# Patient Record
Sex: Male | Born: 1975 | Race: White | Hispanic: No | Marital: Married | Smoking: Never smoker
Health system: Southern US, Community
[De-identification: ages and names within clinical notes are randomized; demographics above are authoritative.]

## PROBLEM LIST (undated history)

## (undated) DIAGNOSIS — J45909 Unspecified asthma, uncomplicated: Secondary | ICD-10-CM

## (undated) DIAGNOSIS — M109 Gout, unspecified: Secondary | ICD-10-CM

---

## 2016-12-07 ENCOUNTER — Encounter (HOSPITAL_COMMUNITY): Payer: Self-pay | Admitting: Family Medicine

## 2016-12-07 ENCOUNTER — Emergency Department (HOSPITAL_COMMUNITY)
Admission: EM | Admit: 2016-12-07 | Discharge: 2016-12-08 | Disposition: A | Discharge status: Home / Self Care | Payer: BLUE CROSS/BLUE SHIELD | Attending: Emergency Medicine | Admitting: Emergency Medicine

## 2016-12-07 DIAGNOSIS — M109 Gout, unspecified: Secondary | ICD-10-CM | POA: Insufficient documentation

## 2016-12-07 DIAGNOSIS — L03116 Cellulitis of left lower limb: Secondary | ICD-10-CM | POA: Insufficient documentation

## 2016-12-07 DIAGNOSIS — L539 Erythematous condition, unspecified: Secondary | ICD-10-CM | POA: Diagnosis present

## 2016-12-07 HISTORY — DX: Gout, unspecified: M10.9

## 2016-12-07 HISTORY — DX: Unspecified asthma, uncomplicated: J45.909

## 2016-12-07 MED ORDER — DEXAMETHASONE SODIUM PHOSPHATE 10 MG/ML IJ SOLN
10.0000 mg | Freq: Once | INTRAMUSCULAR | Status: AC
  Administered 2016-12-07: 10 mg via INTRAMUSCULAR
  Filled 2016-12-07: qty 1

## 2016-12-07 MED ORDER — DOXYCYCLINE HYCLATE 100 MG PO CAPS: 100.0000 mg | ORAL_CAPSULE | Freq: Two times a day (BID) | ORAL | 0 refills | Status: DC

## 2016-12-07 NOTE — ED Provider Notes (Signed)
[facility]-EMERGENCY DEPT Provider Note   CSN: [id number] Arrival date & time: 12/07/16  2031     History   Chief Complaint Chief Complaint  Patient presents with  . Insect Bite    HPI Dustin Orr is a 41 y.o. male.  Patient presents to the ER for evaluation of a bite on the left leg. Patient reports that he felt an itchy area on the back of his left lower leg 4 days ago. He scratched the area and it popped "like a zit". Since then the area has been tender and surrounded by reddened area. No further drainage.  Patient also reports that this evening he started having pain and swelling of his left great toe. 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 Denies injury.      Past Medical History:  Diagnosis Date  . Asthma   . Gout     There are no active problems to display for this patient.   History reviewed. No pertinent surgical history.     Home Medications    Prior to Admission medications   Medication Sig Start Date End Date Taking? Authorizing Provider  albuterol (PROVENTIL HFA;VENTOLIN HFA) 108 (90 Base) MCG/ACT inhaler Inhale 1-2 puffs into the lungs every 6 (six) hours as needed for wheezing or shortness of breath.   Yes [provider]  indomethacin (INDOCIN) 50 MG capsule Take 50 mg by mouth daily as needed for other. 12/02/16  Yes [provider]  Multiple Vitamin (MULTIVITAMIN WITH MINERALS) TABS tablet Take 1 tablet by mouth daily.   Yes [provider]  Omega-3 Fatty Acids (FISH OIL BURP-LESS PO) Take 1 tablet by mouth daily.   Yes [provider]  Probiotic Product (PROBIOTIC ADVANCED PO) Take 1 tablet by mouth daily.   Yes [provider]  doxycycline (VIBRAMYCIN) 100 MG capsule Take 1 capsule (100 mg total) by mouth 2 (two) times daily. 12/07/16   Gilda Crease, MD    Family History History reviewed. No pertinent family history.  Social History Social History  Substance Use  Topics  . Smoking status: Never Smoker  . Smokeless tobacco: Never Used  . Alcohol use Yes     Comment: Weekends     Allergies   Penicillins   Review of Systems Review of Systems  Musculoskeletal: Positive for arthralgias.  Skin: Positive for wound.  All other systems reviewed and are negative.    Physical Exam Updated Vital Signs BP (!) 143/99 (BP Location: Left Arm)   Pulse 76   Temp 97.8 F (36.6 C) (Oral)   Resp 20   Ht 6\' 3"  (1.905 m)   Wt 117.9 kg (260 lb)   SpO2 95%   BMI 32.50 kg/m   Physical Exam  Constitutional: He is oriented to person, place, and time. He appears well-developed and well-nourished. No distress.  HENT:  Head: Normocephalic and atraumatic.  Right Ear: Hearing normal.  Left Ear: Hearing normal.  Nose: Nose normal.  Mouth/Throat: Oropharynx is clear and moist and mucous membranes are normal.  Eyes: Pupils are equal, round, and reactive to light. Conjunctivae and EOM are normal.  Neck: Normal range of motion. Neck supple.  Cardiovascular: Regular rhythm, S1 normal and S2 normal.  Exam reveals no gallop and no friction rub.   No murmur heard. Pulmonary/Chest: Effort normal and breath sounds normal. No respiratory distress. He exhibits no tenderness.  Abdominal: Soft. Normal appearance and bowel sounds are normal. There is no hepatosplenomegaly. There  is no tenderness. There is no rebound, no guarding, no tenderness at McBurney's point and negative Murphy's sign. No hernia.  Musculoskeletal: Normal range of motion.       Left foot: There is tenderness (great toe) and swelling (great toe).  Neurological: He is alert and oriented to person, place, and time. He has normal strength. No cranial nerve deficit or sensory deficit. Coordination normal. GCS eye subscore is 4. GCS verbal subscore is 5. GCS motor subscore is 6.  Skin: Skin is warm, dry and intact. No rash noted. No cyanosis.  1cm eschar surrounded by 2cm area of erythema - no fluctuance    Psychiatric: He has a normal mood and affect. His speech is normal and behavior is normal. Thought content normal.  Nursing note and vitals reviewed.    ED Treatments / Results  Labs (all labs ordered are listed, but only abnormal results are displayed) Labs Reviewed - No data to display  EKG  EKG Interpretation None       Radiology No results found.  Procedures Procedures (including critical care time)  Medications Ordered in ED Medications  dexamethasone (DECADRON) injection 10 mg (not administered)     Initial Impression / Assessment and Plan / ED Course  I have reviewed the triage vital signs and the nursing notes.  Pertinent labs & imaging results that were available during my care of the patient were reviewed by me and considered in my medical decision making (see chart for details).     Patient presents primarily with concern over lesion on the back of his leg. The area does appear to have some central eschar surrounded by erythema. It is difficult to determine if this is local irritation from envenomation or if it is infection. Will empirically cover. Patient does not have any rash elsewhere. No known tick bite. Rash does not resemble erythema chronicum migrans.  Additionally, patient is suffering from acute gout flare of his great toe, has had recurrent similar symptoms in the past. Will treat with Decadron.  Final Clinical Impressions(s) / ED Diagnoses   Final diagnoses:  Acute gout involving toe of left foot, unspecified cause  Cellulitis of left lower extremity    New Prescriptions New Prescriptions   DOXYCYCLINE (VIBRAMYCIN) 100 MG CAPSULE    Take 1 capsule (100 mg total) by mouth 2 (two) times daily.     Gilda CreasePollina, Keeven Matty J, MD 12/07/16 [phone]

## 2016-12-07 NOTE — ED Triage Notes (Signed)
Patient reports on Friday he noticed he had a itch on his left calf leg. When he scratched, he heard a pop. Pt believes he got bit my an insect but never felt the bite. Patients left calf is swollen, and has an abscess to his left calf. Denies fever.

## 2021-05-21 ENCOUNTER — Other Ambulatory Visit: Payer: Self-pay

## 2021-05-21 ENCOUNTER — Emergency Department (HOSPITAL_BASED_OUTPATIENT_CLINIC_OR_DEPARTMENT_OTHER): Payer: Commercial Managed Care - PPO

## 2021-05-21 ENCOUNTER — Emergency Department (HOSPITAL_BASED_OUTPATIENT_CLINIC_OR_DEPARTMENT_OTHER)
Admission: EM | Admit: 2021-05-21 | Discharge: 2021-05-21 | Disposition: A | Discharge status: Home / Self Care | Payer: Commercial Managed Care - PPO | Attending: Emergency Medicine | Admitting: Emergency Medicine

## 2021-05-21 ENCOUNTER — Encounter (HOSPITAL_BASED_OUTPATIENT_CLINIC_OR_DEPARTMENT_OTHER): Payer: Self-pay

## 2021-05-21 DIAGNOSIS — Z79899 Other long term (current) drug therapy: Secondary | ICD-10-CM | POA: Insufficient documentation

## 2021-05-21 DIAGNOSIS — Y9301 Activity, walking, marching and hiking: Secondary | ICD-10-CM | POA: Insufficient documentation

## 2021-05-21 DIAGNOSIS — S022XXA Fracture of nasal bones, initial encounter for closed fracture: Secondary | ICD-10-CM | POA: Diagnosis not present

## 2021-05-21 DIAGNOSIS — S01111A Laceration without foreign body of right eyelid and periocular area, initial encounter: Secondary | ICD-10-CM | POA: Diagnosis not present

## 2021-05-21 DIAGNOSIS — S01112A Laceration without foreign body of left eyelid and periocular area, initial encounter: Secondary | ICD-10-CM | POA: Insufficient documentation

## 2021-05-21 DIAGNOSIS — W01198A Fall on same level from slipping, tripping and stumbling with subsequent striking against other object, initial encounter: Secondary | ICD-10-CM | POA: Insufficient documentation

## 2021-05-21 DIAGNOSIS — S0181XA Laceration without foreign body of other part of head, initial encounter: Secondary | ICD-10-CM

## 2021-05-21 DIAGNOSIS — S0993XA Unspecified injury of face, initial encounter: Secondary | ICD-10-CM | POA: Diagnosis present

## 2021-05-21 MED ORDER — LIDOCAINE HCL (PF) 1 % IJ SOLN
30.0000 mL | Freq: Once | INTRAMUSCULAR | Status: AC
  Administered 2021-05-21: 30 mL via INTRADERMAL
  Filled 2021-05-21: qty 30

## 2021-05-21 MED ORDER — LIDOCAINE-EPINEPHRINE-TETRACAINE (LET) TOPICAL GEL
3.0000 mL | Freq: Once | TOPICAL | Status: AC
Start: 2021-05-21 — End: 2021-05-21
  Administered 2021-05-21: 3 mL via TOPICAL
  Filled 2021-05-21: qty 3

## 2021-05-21 MED ORDER — OXYCODONE-ACETAMINOPHEN 5-325 MG PO TABS
1.0000 | ORAL_TABLET | Freq: Once | ORAL | Status: AC
  Administered 2021-05-21: 1 via ORAL
  Filled 2021-05-21: qty 1

## 2021-05-21 MED ORDER — OXYCODONE-ACETAMINOPHEN 5-325 MG PO TABS: 1.0000 | ORAL_TABLET | Freq: Four times a day (QID) | ORAL | 0 refills | Status: DC | PRN

## 2021-05-21 MED ORDER — LIDOCAINE-EPINEPHRINE (PF) 2 %-1:200000 IJ SOLN
20.0000 mL | Freq: Once | INTRAMUSCULAR | Status: DC
  Filled 2021-05-21: qty 20

## 2021-05-21 NOTE — ED Provider Notes (Signed)
[facility] EMERGENCY DEPT Provider Note   CSN: [id number] Arrival date & time: 05/21/21  1557     History  Chief Complaint  Patient presents with   Quan Cybulski is a 46 y.o. male who presents to the emergency department with a facial injury that occurred just prior to arrival.  Patient states that he was walking in his workshop when he slipped on some water fell forward and struck his head against a metal workstation.  He denies losing consciousness.  He did sustain 2 lacerations which are localized to both eyebrows.  No other injury. He denies pain.   Fall      Home Medications Prior to Admission medications   Medication Sig Start Date End Date Taking? Authorizing Provider  oxyCODONE-acetaminophen (PERCOCET/ROXICET) 5-325 MG tablet Take 1 tablet by mouth every 6 (six) hours as needed for severe pain. 05/21/21  Yes Meredeth Ide, Ryleah Miramontes M, PA-C  albuterol (PROVENTIL HFA;VENTOLIN HFA) 108 (90 Base) MCG/ACT inhaler Inhale 1-2 puffs into the lungs every 6 (six) hours as needed for wheezing or shortness of breath.    [provider]  doxycycline (VIBRAMYCIN) 100 MG capsule Take 1 capsule (100 mg total) by mouth 2 (two) times daily. 12/07/16   Gilda Crease, MD  indomethacin (INDOCIN) 50 MG capsule Take 50 mg by mouth daily as needed for other. 12/02/16   [provider]  Multiple Vitamin (MULTIVITAMIN WITH MINERALS) TABS tablet Take 1 tablet by mouth daily.    [provider]  Omega-3 Fatty Acids (FISH OIL BURP-LESS PO) Take 1 tablet by mouth daily.    [provider]  Probiotic Product (PROBIOTIC ADVANCED PO) Take 1 tablet by mouth daily.    [provider]      Allergies    Penicillins    Review of Systems   Review of Systems  All other systems reviewed and are negative.  Physical Exam Updated Vital Signs BP (!) 174/116    Pulse (!) 107    Temp 98.8 F (37.1 C) (Oral)    Resp 17    Ht 6\' 3"  (1.905 m)    Wt  115.7 kg    SpO2 98%    BMI 31.87 kg/m  Physical Exam Vitals and nursing note reviewed.  Constitutional:      Appearance: Normal appearance.  HENT:     Head: Normocephalic.     Comments: Developing bilateral ecchymosis surrounding the eyes.  No painful extraocular movements.  There is approximately 4 cm stellate laceration above the left eyebrow.  There is approximately a 2.5cm linear laceration over the right eyebrow.  There is a superficial abrasion over the bridge of the nose. Eyes:     General:        Right eye: No discharge.        Left eye: No discharge.     Conjunctiva/sclera: Conjunctivae normal.  Pulmonary:     Effort: Pulmonary effort is normal.  Skin:    General: Skin is warm and dry.     Findings: No rash.  Neurological:     General: No focal deficit present.     Mental Status: He is alert.  Psychiatric:        Mood and Affect: Mood normal.        Behavior: Behavior normal.    ED Results / Procedures / Treatments   Labs (all labs ordered are listed, but only abnormal results are displayed) Labs Reviewed - No data to display  EKG None  Radiology CT Head Wo Contrast  Result Date: 05/21/2021 CLINICAL DATA:  Facial trauma, blunt facial trauma; Head trauma, moderate-severe head injury EXAM: CT HEAD WITHOUT CONTRAST CT MAXILLOFACIAL WITHOUT CONTRAST TECHNIQUE: Multidetector CT imaging of the head and maxillofacial structures were performed using the standard protocol without intravenous contrast. Multiplanar CT image reconstructions of the maxillofacial structures were also generated. RADIATION DOSE REDUCTION: This exam was performed according to the departmental dose-optimization program which includes automated exposure control, adjustment of the mA and/or kV according to patient size and/or use of iterative reconstruction technique. COMPARISON:  None. FINDINGS: CT HEAD FINDINGS Brain: No evidence of large-territorial acute infarction. No parenchymal hemorrhage. No mass  lesion. No extra-axial collection. No mass effect or midline shift. No hydrocephalus. Basilar cisterns are patent. Vascular: No hyperdense vessel. Skull: No acute fracture or focal lesion. Other: Mild bilateral frontal scalp subcutaneus soft tissue edema. CT MAXILLOFACIAL FINDINGS Osseous: Bilateral minimally displaced comminuted fractures of the nasal bones as well as comminuted fracture of the nasal septum with 6mm leftward deviation. Sinuses/Orbits: Subcutaneus soft tissue edema of the maxillary sinuses. Paranasal sinuses and mastoid air cells are clear. The orbits are unremarkable. Soft tissues: Left periorbital subcutaneus soft tissue a edema and small hematoma formation. Right periorbital subcutaneus soft tissue edema and small hematoma formation with extension along the right maxillary subcutaneus soft tissues. Nasal subcutaneus soft tissue edema. IMPRESSION: 1. No acute intracranial abnormality. 2. Bilateral minimally displaced comminuted fractures of the nasal bones as well as comminuted fracture of the nasal septum with 6mm leftward deviation. Electronically Signed   By: Tish Frederickson M.D.   On: 05/21/2021 17:34   CT Maxillofacial Wo Contrast  Result Date: 05/21/2021 CLINICAL DATA:  Facial trauma, blunt facial trauma; Head trauma, moderate-severe head injury EXAM: CT HEAD WITHOUT CONTRAST CT MAXILLOFACIAL WITHOUT CONTRAST TECHNIQUE: Multidetector CT imaging of the head and maxillofacial structures were performed using the standard protocol without intravenous contrast. Multiplanar CT image reconstructions of the maxillofacial structures were also generated. RADIATION DOSE REDUCTION: This exam was performed according to the departmental dose-optimization program which includes automated exposure control, adjustment of the mA and/or kV according to patient size and/or use of iterative reconstruction technique. COMPARISON:  None. FINDINGS: CT HEAD FINDINGS Brain: No evidence of large-territorial acute  infarction. No parenchymal hemorrhage. No mass lesion. No extra-axial collection. No mass effect or midline shift. No hydrocephalus. Basilar cisterns are patent. Vascular: No hyperdense vessel. Skull: No acute fracture or focal lesion. Other: Mild bilateral frontal scalp subcutaneus soft tissue edema. CT MAXILLOFACIAL FINDINGS Osseous: Bilateral minimally displaced comminuted fractures of the nasal bones as well as comminuted fracture of the nasal septum with 6mm leftward deviation. Sinuses/Orbits: Subcutaneus soft tissue edema of the maxillary sinuses. Paranasal sinuses and mastoid air cells are clear. The orbits are unremarkable. Soft tissues: Left periorbital subcutaneus soft tissue a edema and small hematoma formation. Right periorbital subcutaneus soft tissue edema and small hematoma formation with extension along the right maxillary subcutaneus soft tissues. Nasal subcutaneus soft tissue edema. IMPRESSION: 1. No acute intracranial abnormality. 2. Bilateral minimally displaced comminuted fractures of the nasal bones as well as comminuted fracture of the nasal septum with 6mm leftward deviation. Electronically Signed   By: Tish Frederickson M.D.   On: 05/21/2021 17:34    Procedures .Marland KitchenLaceration Repair  Date/Time: 05/21/2021 6:30 PM Performed by: Teressa Lower, PA-C Authorized by: Teressa Lower, PA-C   Consent:    Consent obtained:  Verbal   Consent given by:  Patient  Risks, benefits, and alternatives were discussed: yes     Risks discussed:  Infection, pain and need for additional repair   Alternatives discussed:  No treatment Universal protocol:    Procedure explained and questions answered to patient or proxy's satisfaction: yes     Relevant documents present and verified: yes     Test results available: yes     Imaging studies available: yes     Patient identity confirmed:  Verbally with patient and arm band Anesthesia:    Anesthesia method:  Topical application and local  infiltration   Topical anesthetic:  LET   Local anesthetic:  Lidocaine 2% w/o epi Laceration details:    Location:  Face   Face location:  R eyebrow   Length (cm):  2.5   Depth (mm):  4 Pre-procedure details:    Preparation:  Patient was prepped and draped in usual sterile fashion Exploration:    Limited defect created (wound extended): no     Hemostasis achieved with:  Direct pressure   Imaging outcome: foreign body not noted     Wound exploration: wound explored through full range of motion and entire depth of wound visualized     Wound extent: areolar tissue violated     Contaminated: no   Treatment:    Area cleansed with:  Chlorhexidine and saline   Amount of cleaning:  Standard   Irrigation solution:  Sterile saline   Debridement:  None   Undermining:  None   Scar revision: no   Skin repair:    Repair method:  Sutures   Suture size:  6-0   Suture material:  Prolene   Number of sutures:  4 Approximation:    Approximation:  Close Repair type:    Repair type:  Complex Post-procedure details:    Dressing:  Antibiotic ointment and non-adherent dressing   Procedure completion:  Tolerated well, no immediate complications .Marland Kitchen.Laceration Repair  Date/Time: 05/21/2021 6:32 PM Performed by: Teressa LowerFleming, Tonesha Tsou M, PA-C Authorized by: Teressa LowerFleming, Alvy Alsop M, PA-C   Consent:    Consent obtained:  Verbal   Consent given by:  Patient   Risks, benefits, and alternatives were discussed: yes     Risks discussed:  Need for additional repair, poor cosmetic result and infection Universal protocol:    Procedure explained and questions answered to patient or proxy's satisfaction: yes     Relevant documents present and verified: yes     Test results available: yes     Imaging studies available: yes     Required blood products, implants, devices, and special equipment available: no     Site/side marked: no     Immediately prior to procedure, a time out was called: no     Patient identity  confirmed:  Verbally with patient and arm band Anesthesia:    Anesthesia method:  Topical application and local infiltration   Topical anesthetic:  LET   Local anesthetic:  Lidocaine 2% w/o epi Laceration details:    Location:  Face   Face location:  L eyebrow   Length (cm):  4.5   Depth (mm):  6 Pre-procedure details:    Preparation:  Patient was prepped and draped in usual sterile fashion Exploration:    Limited defect created (wound extended): no     Hemostasis achieved with:  Direct pressure   Imaging outcome: foreign body not noted     Wound exploration: wound explored through full range of motion and entire depth of wound visualized  Wound extent: areolar tissue violated     Contaminated: no   Treatment:    Area cleansed with:  Chlorhexidine and saline   Amount of cleaning:  Extensive   Irrigation solution:  Sterile saline   Visualized foreign bodies/material removed: no     Debridement:  None   Undermining:  None   Scar revision: no   Skin repair:    Repair method:  Sutures   Suture size:  6-0   Suture material:  Prolene   Suture technique:  Simple interrupted   Number of sutures:  8 Approximation:    Approximation:  Close Repair type:    Repair type:  Complex Post-procedure details:    Dressing:  Antibiotic ointment and non-adherent dressing   Procedure completion:  Tolerated well, no immediate complications  Cardiac monitoring reveals normal sinus rhythm but is tachycardic.  This is likely due to secondary to pain.  Medications Ordered in ED Medications  lidocaine-EPINEPHrine-tetracaine (LET) topical gel (3 mLs Topical Given 05/21/21 1636)  lidocaine (PF) (XYLOCAINE) 1 % injection 30 mL (30 mLs Intradermal Given 05/21/21 1636)  oxyCODONE-acetaminophen (PERCOCET/ROXICET) 5-325 MG per tablet 1 tablet (1 tablet Oral Given 05/21/21 1837)    ED Course/ Medical Decision Making/ A&P Clinical Course as of 05/21/21 1841  Thu May 21, 2021  [id number] 46 year old male  presented emergency department with a mechanical fall, frontal head injury, striking his head on a hard workbench surface today.  He presents with a 2 cm horizontal laceration in the lateral aspect of the right eyebrow, as well as a stellate laceration involving the left eyebrow.  He also demonstrates raccoon eyes on exam.  No hemotympanum or battle sign.  No neck or spinal tenderness or pain.  He reports he does not have a headache.  He is here with his wife at bedside.  We have ordered CT scan of the head and the facial bone given his exam findings.  He will also do laceration repair. [MT]    Clinical Course User Index [MT] Trifan, Kermit Balo, MD                           Medical Decision Making Amount and/or Complexity of Data Reviewed Radiology: ordered.  Risk Prescription drug management.   Larue Lightner is a 46 y.o. male who presents to the emergency department with facial trauma.  Given the mechanism of injury I would like to evaluate with facial imaging and head imaging for intracranial pathology or acute facial fractures.  He does have raccoon eyes developing on exam.  We will likely repair the lacerations after imaging returns.  Patient does not want any pain medication at this time.  He is tachycardic and hypertensive likely to pain.  He is otherwise in no acute distress talking complete sentences.  He is coherent and not altered.  Imaging of the face revealed nasal bone fractures.  CT head was negative.  I agree with the radiologist of rotation.  I did interpret his images myself.  I repaired both lacerations at the bedside.  Please see procedure note above.  Patient tolerated procedure well.  With regards to the nasal bone fractures I will have him follow-up with maxillofacial for further evaluation.  We discussed return precautions.  He will need the sutures out in 5 to 7 days.  Tylenol/ibuprofen for pain.  I will also write him a short course of narcotic pain medication for  breakthrough pain.  We did discuss  concussion symptoms and red flag symptoms.  Given the clinical scenario, I do believe he is safe for outpatient follow-up.  He is safe for discharge.  Final Clinical Impression(s) / ED Diagnoses Final diagnoses:  Facial injury, initial encounter  Facial laceration, initial encounter  Closed fracture of nasal bone, initial encounter    Rx / DC Orders ED Discharge Orders          Ordered    oxyCODONE-acetaminophen (PERCOCET/ROXICET) 5-325 MG tablet  Every 6 hours PRN,   Status:  Discontinued        05/21/21 1839    oxyCODONE-acetaminophen (PERCOCET/ROXICET) 5-325 MG tablet  Every 6 hours PRN,   Status:  Discontinued        05/21/21 1839    oxyCODONE-acetaminophen (PERCOCET/ROXICET) 5-325 MG tablet  Every 6 hours PRN        05/21/21 1840              Honor LohFleming, Arieanna Pressey DoomsM, [place] JerseyPA-C 05/21/21 1842    Terald Sleeperrifan, Matthew J, MD 05/22/21 1057

## 2021-05-21 NOTE — Discharge Instructions (Addendum)
Please keep wounds clean and dry.  You may apply Neosporin to the areas.  Please follow-up with ENT for further evaluation of your nasal fractures.  Please use Percocet for breakthrough pain.  Otherwise, take 600 mg ibuprofen every 6 hours for pain.  Please return to the emergency department for worsening symptoms.  Please return to this emergency department or any of our other [facility] facilities in 5 to 7 days for suture removal.

## 2021-05-21 NOTE — ED Triage Notes (Signed)
Pt reports he had a mechanical fall slipping on water. Pt hit his head on a tool box. Pt with lacerations above both eyes. Denies LOC or vomiting. Pt reports he had back pain prior to the fall which is now worse.

## 2021-12-02 ENCOUNTER — Encounter: Payer: Self-pay | Admitting: Adult Health

## 2021-12-02 ENCOUNTER — Ambulatory Visit (INDEPENDENT_AMBULATORY_CARE_PROVIDER_SITE_OTHER): Payer: Commercial Managed Care - PPO | Admitting: Adult Health

## 2021-12-02 VITALS — BP 130/90 | HR 97 | Temp 97.6°F | Ht 74.5 in | Wt 266.0 lb

## 2021-12-02 DIAGNOSIS — J4530 Mild persistent asthma, uncomplicated: Secondary | ICD-10-CM | POA: Diagnosis not present

## 2021-12-02 DIAGNOSIS — Z1211 Encounter for screening for malignant neoplasm of colon: Secondary | ICD-10-CM

## 2021-12-02 DIAGNOSIS — R030 Elevated blood-pressure reading, without diagnosis of hypertension: Secondary | ICD-10-CM

## 2021-12-02 DIAGNOSIS — M1A09X0 Idiopathic chronic gout, multiple sites, without tophus (tophi): Secondary | ICD-10-CM | POA: Diagnosis not present

## 2021-12-02 DIAGNOSIS — Z7689 Persons encountering health services in other specified circumstances: Secondary | ICD-10-CM

## 2021-12-02 LAB — URIC ACID: Uric Acid, Serum: 9.4 mg/dL — ABNORMAL HIGH (ref 4.0–7.8)

## 2021-12-02 MED ORDER — ALLOPURINOL 100 MG PO TABS: 100.0000 mg | ORAL_TABLET | Freq: Every day | ORAL | 0 refills | Status: DC

## 2021-12-02 NOTE — Progress Notes (Signed)
Patient presents to clinic today to establish care. He is a pleasant 46 year old male who  has a past medical history of Asthma and Gout.   Acute Concerns: Establish Care  Chronic Issues:  Asthma -is seen at allergy and asthma at [facility].  Currently controlled on Flovent 2 puffs twice daily as needed and albuterol 2 puffs as needed.  Symptoms are generally well controlled he typically needs Flovent 2 puffs every day in the morning and may use his rescue inhaler once a week but this varies with weather changes.  Gout - takes Indomethacin 50 mg TID PRN for acute flares. He reports over the last year he has not been able to control it. In the past two months he has had it both feet, both knees, and left elbow. Currently has pain in his right knee.   Elevated Blood pressure -he has not checked at home in multiple weeks but when he was checking he was getting elevated blood pressure readings mostly in the 140s over 90's.  He recently started a new job and has a 7-week at home that was premature.  Seems to have a lot of stress in his life right now.   Health Maintenance: Dental -- Routine Care  Vision -- Routine Care  Immunizations -- UTD  Colonoscopy -- Never had  Diet: Tries to eat a healthy diet Exercise: Exercises multiple times a week with cardio fitness.     Past Medical History:  Diagnosis Date   Asthma    Gout     History reviewed. No pertinent surgical history.  Current Outpatient Medications on File Prior to Visit  Medication Sig Dispense Refill   albuterol (PROVENTIL HFA;VENTOLIN HFA) 108 (90 Base) MCG/ACT inhaler Inhale 1-2 puffs into the lungs every 6 (six) hours as needed for wheezing or shortness of breath.     fluticasone (FLOVENT HFA) 110 MCG/ACT inhaler Inhale 2 puffs into the lungs 2 (two) times daily.     indomethacin (INDOCIN) 50 MG capsule Take 50 mg by mouth daily as needed for other.     loratadine (CLARITIN REDITABS) 10 MG  dissolvable tablet Take by mouth.     Multiple Vitamin (MULTIVITAMIN WITH MINERALS) TABS tablet Take 1 tablet by mouth daily.     Probiotic Product (PROBIOTIC ADVANCED PO) Take 1 tablet by mouth daily.     No current facility-administered medications on file prior to visit.    Allergies  Allergen Reactions   Penicillins Rash    Has patient had a PCN reaction causing immediate rash, facial/tongue/throat swelling, SOB or lightheadedness with hypotension: no Has patient had a PCN reaction causing severe rash involving mucus membranes or skin necrosis: no Has patient had a PCN reaction that required hospitalization: no Has patient had a PCN reaction occurring within the last 10 years: no If all of the above answers are "NO", then may proceed with Cephalosporin use.     Family History  Problem Relation Age of Onset   Hearing loss Mother    Hearing loss Father    Diabetes Maternal Grandfather    Scoliosis Paternal Grandmother     Social History   Socioeconomic History   Marital status: Married    Spouse name: Not on file   Number of children: Not on file   Years of education: Not on file   Highest education level: Not on file  Occupational History   Not on file  Tobacco Use   Smoking status:  Never   Smokeless tobacco: Never  Vaping Use   Vaping Use: Never used  Substance and Sexual Activity   Alcohol use: Yes    Alcohol/week: 12.0 standard drinks of alcohol    Types: 6 Glasses of wine, 6 Cans of beer per week    Comment: Weekends   Drug use: No   Sexual activity: Yes  Other Topics Concern   Not on file  Social History Narrative   Art gallery manager    Social Determinants of Health   Financial Resource Strain: Not on file  Food Insecurity: Not on file  Transportation Needs: Not on file  Physical Activity: Not on file  Stress: Not on file  Social Connections: Not on file  Intimate Partner Violence: Not on file    Review of Systems  Constitutional: Negative.   HENT:  Negative.    Eyes: Negative.   Respiratory: Negative.    Cardiovascular: Negative.   Gastrointestinal: Negative.   Genitourinary: Negative.   Musculoskeletal: Negative.   Skin: Negative.   Neurological: Negative.   Psychiatric/Behavioral: Negative.    All other systems reviewed and are negative.   BP (!) 130/90   Pulse 97   Temp 97.6 F (36.4 C) (Oral)   Ht 6' 2.5" (1.892 m)   Wt 266 lb (120.7 kg)   SpO2 98%   BMI 33.70 kg/m   Physical Exam Vitals and nursing note reviewed.  Constitutional:      Appearance: Normal appearance. He is obese.  Cardiovascular:     Rate and Rhythm: Normal rate and regular rhythm.     Pulses: Normal pulses.     Heart sounds: Normal heart sounds.  Pulmonary:     Effort: Pulmonary effort is normal.     Breath sounds: Normal breath sounds.  Musculoskeletal:        General: Normal range of motion.  Skin:    General: Skin is warm and dry.     Capillary Refill: Capillary refill takes less than 2 seconds.  Neurological:     General: No focal deficit present.     Mental Status: He is alert and oriented to person, place, and time.  Psychiatric:        Mood and Affect: Mood normal.        Behavior: Behavior normal.        Thought Content: Thought content normal.        Judgment: Judgment normal.      Assessment/Plan: 1. Encounter to establish care - Follow up in one month or sooner if needed  2. Mild persistent asthma without complication - Per allergy and asthma   3. Colon cancer screening  - Ambulatory referral to Gastroenterology  4. Chronic gout of multiple sites, unspecified cause - Due to amount of gout flares will start on allopurinol - Retest uric acid in one month  - allopurinol (ZYLOPRIM) 100 MG tablet; Take 1 tablet (100 mg total) by mouth daily.  Dispense: 90 tablet; Refill: 0 - Uric Acid; Future - Uric Acid  5. Elevated blood pressure reading - Will have him monitor at home and bring log with him    Shirline Frees,  NP

## 2021-12-02 NOTE — Patient Instructions (Signed)
It was great meeting you today   Someone will call you to schedule your colonoscopy   I am going to check your uric acid level today and I have sent in allopurinol   Follow up in one month for your physical exam

## 2022-01-01 ENCOUNTER — Ambulatory Visit (INDEPENDENT_AMBULATORY_CARE_PROVIDER_SITE_OTHER): Payer: Commercial Managed Care - PPO | Admitting: Adult Health

## 2022-01-01 ENCOUNTER — Encounter: Payer: Self-pay | Admitting: Adult Health

## 2022-01-01 VITALS — BP 128/86 | HR 72 | Temp 97.8°F | Ht 74.5 in | Wt 271.0 lb

## 2022-01-01 DIAGNOSIS — Z0001 Encounter for general adult medical examination with abnormal findings: Secondary | ICD-10-CM | POA: Diagnosis not present

## 2022-01-01 DIAGNOSIS — E669 Obesity, unspecified: Secondary | ICD-10-CM

## 2022-01-01 DIAGNOSIS — N529 Male erectile dysfunction, unspecified: Secondary | ICD-10-CM

## 2022-01-01 DIAGNOSIS — R030 Elevated blood-pressure reading, without diagnosis of hypertension: Secondary | ICD-10-CM

## 2022-01-01 DIAGNOSIS — Z114 Encounter for screening for human immunodeficiency virus [HIV]: Secondary | ICD-10-CM

## 2022-01-01 DIAGNOSIS — J4530 Mild persistent asthma, uncomplicated: Secondary | ICD-10-CM | POA: Diagnosis not present

## 2022-01-01 DIAGNOSIS — M1A09X0 Idiopathic chronic gout, multiple sites, without tophus (tophi): Secondary | ICD-10-CM | POA: Diagnosis not present

## 2022-01-01 DIAGNOSIS — Z1211 Encounter for screening for malignant neoplasm of colon: Secondary | ICD-10-CM

## 2022-01-01 DIAGNOSIS — Z1159 Encounter for screening for other viral diseases: Secondary | ICD-10-CM

## 2022-01-01 DIAGNOSIS — Z23 Encounter for immunization: Secondary | ICD-10-CM | POA: Diagnosis not present

## 2022-01-01 LAB — CBC WITH DIFFERENTIAL/PLATELET
Basophils Absolute: 0.1 10*3/uL (ref 0.0–0.1)
Basophils Relative: 0.8 % (ref 0.0–3.0)
Eosinophils Absolute: 0.2 10*3/uL (ref 0.0–0.7)
Eosinophils Relative: 3.1 % (ref 0.0–5.0)
HCT: 44.9 % (ref 39.0–52.0)
Hemoglobin: 15.6 g/dL (ref 13.0–17.0)
Lymphocytes Relative: 24.8 % (ref 12.0–46.0)
Lymphs Abs: 1.7 10*3/uL (ref 0.7–4.0)
MCHC: 34.7 g/dL (ref 30.0–36.0)
MCV: 89.6 fl (ref 78.0–100.0)
Monocytes Absolute: 0.7 10*3/uL (ref 0.1–1.0)
Monocytes Relative: 10.3 % (ref 3.0–12.0)
Neutro Abs: 4.2 10*3/uL (ref 1.4–7.7)
Neutrophils Relative %: 61 % (ref 43.0–77.0)
Platelets: 254 10*3/uL (ref 150.0–400.0)
RBC: 5.01 Mil/uL (ref 4.22–5.81)
RDW: 13.2 % (ref 11.5–15.5)
WBC: 6.9 10*3/uL (ref 4.0–10.5)

## 2022-01-01 LAB — COMPREHENSIVE METABOLIC PANEL
ALT: 43 U/L (ref 0–53)
AST: 33 U/L (ref 0–37)
Albumin: 4.5 g/dL (ref 3.5–5.2)
Alkaline Phosphatase: 97 U/L (ref 39–117)
BUN: 21 mg/dL (ref 6–23)
CO2: 30 mEq/L (ref 19–32)
Calcium: 9.8 mg/dL (ref 8.4–10.5)
Chloride: 101 mEq/L (ref 96–112)
Creatinine, Ser: 1.14 mg/dL (ref 0.40–1.50)
GFR: 77.1 mL/min (ref >60.00)
Glucose, Bld: 115 mg/dL — ABNORMAL HIGH (ref 70–99)
Potassium: 4.9 mEq/L (ref 3.5–5.1)
Sodium: 140 mEq/L (ref 135–145)
Total Bilirubin: 0.7 mg/dL (ref 0.2–1.2)
Total Protein: 7.9 g/dL (ref 6.0–8.3)

## 2022-01-01 LAB — LIPID PANEL
Cholesterol: 161 mg/dL (ref 0–200)
HDL: 50 mg/dL (ref >39.00)
LDL Cholesterol: 91 mg/dL (ref 0–99)
NonHDL: 111.22
Total CHOL/HDL Ratio: 3
Triglycerides: 99 mg/dL (ref 0.0–149.0)
VLDL: 19.8 mg/dL (ref 0.0–40.0)

## 2022-01-01 LAB — TSH: TSH: 3.71 u[IU]/mL (ref 0.35–5.50)

## 2022-01-01 LAB — URIC ACID: Uric Acid, Serum: 8.9 mg/dL — ABNORMAL HIGH (ref 4.0–7.8)

## 2022-01-01 LAB — HEMOGLOBIN A1C: Hgb A1c MFr Bld: 5.8 % (ref 4.6–6.5)

## 2022-01-01 MED ORDER — ALLOPURINOL 100 MG PO TABS: 100.0000 mg | ORAL_TABLET | Freq: Every day | ORAL | 3 refills | Status: DC

## 2022-01-01 NOTE — Progress Notes (Signed)
Subjective:    Patient ID: Dustin Orr, male    DOB: Sep 10, 1975, 46 y.o.   MRN: [id number]  HPI Patient presents for yearly preventative medicine examination. He is a pleasant 46 year old male who  has a past medical history of Asthma and Gout.  Asthma-is seen by allergy and asthma at [facility].  Currently controlled on Flovent 2 puffs twice daily as needed and albuterol 2 puffs every 6 hours as needed.  He reports that his symptoms are generally well controlled and he typically needs Flovent in the morning and maybe uses his rescue inhaler once a week  Gout-takes indomethacin 50 mg 3 times daily as needed for acute flares.  During his establish care visit a month ago he reported that he had multiple gout flares over the year with gout presenting and bilateral feet, bilateral knees, and left elbow.  He was placed on allopurinol 100 mg daily at this time. He did not know he had a prescription at the pharmacy for allopurinol and never picked it up  Lab Results  Component Value Date   LABURIC 9.4 (H) 12/02/2021   ED - reports trouble maintaining an erection. Does not have trouble getting an erection.     All immunizations and health maintenance protocols were reviewed with the patient and needed orders were placed.  Appropriate screening laboratory values were ordered for the patient including screening of hyperlipidemia, renal function and hepatic function.  Medication reconciliation,  past medical history, social history, problem list and allergies were reviewed in detail with the patient  Goals were established with regard to weight loss, exercise, and  diet in compliance with medications. He has started working out again and is trying to eat healthy.  Wt Readings from Last 3 Encounters:  01/01/22 271 lb (122.9 kg)  12/02/21 266 lb (120.7 kg)  05/21/21 255 lb (115.7 kg)   He would rather do a cologuard test vs colonoscopy.   Review of Systems  Constitutional: Negative.    HENT: Negative.    Eyes: Negative.   Respiratory: Negative.    Cardiovascular: Negative.   Gastrointestinal: Negative.   Endocrine: Negative.   Genitourinary: Negative.   Musculoskeletal: Negative.   Skin: Negative.   Allergic/Immunologic: Negative.   Neurological: Negative.   Hematological: Negative.   Psychiatric/Behavioral: Negative.    All other systems reviewed and are negative.  Past Medical History:  Diagnosis Date   Asthma    Gout     Social History   Socioeconomic History   Marital status: Married    Spouse name: Not on file   Number of children: Not on file   Years of education: Not on file   Highest education level: Not on file  Occupational History   Not on file  Tobacco Use   Smoking status: Never   Smokeless tobacco: Never  Vaping Use   Vaping Use: Never used  Substance and Sexual Activity   Alcohol use: Yes    Alcohol/week: 12.0 standard drinks of alcohol    Types: 6 Glasses of wine, 6 Cans of beer per week    Comment: Weekends   Drug use: No   Sexual activity: Yes  Other Topics Concern   Not on file  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 No pertinent surgical history.  Family History  Problem Relation Age of Onset   Hearing loss Mother    Hearing loss Father    Diabetes Maternal Grandfather    Scoliosis Paternal Grandmother     Allergies  Allergen Reactions   Penicillins Rash    Has patient had a PCN reaction causing immediate rash, facial/tongue/throat swelling, SOB or lightheadedness with hypotension: no Has patient had a PCN reaction causing severe rash involving mucus membranes or skin necrosis: no Has patient had a PCN reaction that required  hospitalization: no Has patient had a PCN reaction occurring within the last 10 years: no If all of the above answers are "NO", then may proceed with Cephalosporin use.     Current Outpatient Medications on File Prior to Visit  Medication Sig Dispense Refill   albuterol (PROVENTIL HFA;VENTOLIN HFA) 108 (90 Base) MCG/ACT inhaler Inhale 1-2 puffs into the lungs every 6 (six) hours as needed for wheezing or shortness of breath.     fluticasone (FLOVENT HFA) 110 MCG/ACT inhaler Inhale 2 puffs into the lungs 2 (two) times daily.     indomethacin (INDOCIN) 50 MG capsule Take 50 mg by mouth daily as needed for other.     loratadine (CLARITIN REDITABS) 10 MG dissolvable tablet Take by mouth.     Multiple Vitamin (MULTIVITAMIN WITH MINERALS) TABS tablet Take 1 tablet by mouth daily.     Probiotic Product (PROBIOTIC ADVANCED PO) Take 1 tablet by mouth daily.     No current facility-administered medications on file prior to visit.    BP 128/86   Pulse 72   Temp 97.8 F (36.6 C) (Oral)   Ht 6' 2.5" (1.892 m)   Wt 271 lb (122.9 kg)   SpO2 99%   BMI 34.33 kg/m       Objective:   Physical Exam Vitals and nursing note reviewed.  Constitutional:      General: He is not in acute distress.    Appearance: Normal appearance. He is well-developed. He is obese.  HENT:     Head: Normocephalic and atraumatic.     Right Ear: Tympanic membrane, ear canal and external ear normal. There is no impacted cerumen.     Left Ear: Tympanic membrane, ear canal and external ear normal. There is no impacted cerumen.     Nose: Nose normal. No congestion or rhinorrhea.     Mouth/Throat:     Mouth: Mucous membranes are moist.     Pharynx: Oropharynx is clear. No oropharyngeal exudate or posterior oropharyngeal erythema.  Eyes:     General:        Right eye: No discharge.        Left eye: No discharge.     Extraocular Movements: Extraocular movements intact.     Conjunctiva/sclera: Conjunctivae normal.      Pupils: Pupils are equal, round, and reactive to light.  Neck:     Vascular: No carotid bruit.     Trachea: No tracheal deviation.  Cardiovascular:     Rate and Rhythm: Normal rate and regular rhythm.     Pulses: Normal pulses.     Heart sounds: Normal heart sounds. No murmur heard.    No friction rub. No gallop.  Pulmonary:     Effort: Pulmonary effort is normal. No respiratory distress.     Breath sounds: Normal breath sounds. No stridor. No wheezing, rhonchi or  rales.  Chest:     Chest wall: No tenderness.  Abdominal:     General: Bowel sounds are normal. There is no distension.     Palpations: Abdomen is soft. There is no mass.     Tenderness: There is no abdominal tenderness. There is no right CVA tenderness, left CVA tenderness, guarding or rebound.     Hernia: No hernia is present.  Musculoskeletal:        General: No swelling, tenderness, deformity or signs of injury. Normal range of motion.     Right lower leg: No edema.     Left lower leg: No edema.  Lymphadenopathy:     Cervical: No cervical adenopathy.  Skin:    General: Skin is warm and dry.     Capillary Refill: Capillary refill takes less than 2 seconds.     Coloration: Skin is not jaundiced or pale.     Findings: No bruising, erythema, lesion or rash.  Neurological:     General: No focal deficit present.     Mental Status: He is alert and oriented to person, place, and time.     Cranial Nerves: No cranial nerve deficit.     Sensory: No sensory deficit.     Motor: No weakness.     Coordination: Coordination normal.     Gait: Gait normal.     Deep Tendon Reflexes: Reflexes normal.  Psychiatric:        Mood and Affect: Mood normal.        Behavior: Behavior normal.        Thought Content: Thought content normal.        Judgment: Judgment normal.       Assessment & Plan:  1. Encounter for general adult medical examination with abnormal findings - Encouraged to work on weight loss  - Follow up in one year  or sooner if needed - CBC with Differential/Platelet; Future - Comprehensive metabolic panel; Future - Hemoglobin A1c; Future - Lipid panel; Future - TSH; Future  2. Mild persistent asthma without complication - Controlled.  - CBC with Differential/Platelet; Future - Comprehensive metabolic panel; Future - Hemoglobin A1c; Future - Lipid panel; Future - TSH; Future  3. Chronic gout of multiple sites, unspecified cause - CBC with Differential/Platelet; Future - Comprehensive metabolic panel; Future - Hemoglobin A1c; Future - Lipid panel; Future - TSH; Future - Uric Acid; Future - allopurinol (ZYLOPRIM) 100 MG tablet; Take 1 tablet (100 mg total) by mouth daily.  Dispense: 90 tablet; Refill: 3  4. Encounter for screening for HIV  - HIV Antibody (routine testing w rflx); Future  5. Need for hepatitis C screening test  - Hep C Antibody; Future  6. Screening for colon cancer  - Cologuard  8. Erectile dysfunction, unspecified erectile dysfunction type - Encouraged weight loss  - Can consider Cialis   9. Class 1 obesity - Needs to work on weight loss through diet and exercise  10. Need for Tdap vaccination  - Tdap vaccine greater than or equal to 7yo IM    Shirline Frees, NP

## 2022-01-01 NOTE — Patient Instructions (Signed)
It was great seeing you today   We will follow up with you regarding your lab work   Please let me know if you need anything   

## 2022-01-04 LAB — HEPATITIS C ANTIBODY: Hepatitis C Ab: NONREACTIVE

## 2022-01-04 LAB — HIV ANTIBODY (ROUTINE TESTING W REFLEX): HIV 1&2 Ab, 4th Generation: NONREACTIVE

## 2022-01-05 ENCOUNTER — Other Ambulatory Visit: Payer: Self-pay | Admitting: Adult Health

## 2022-01-05 MED ORDER — TADALAFIL 20 MG PO TABS: 10.0000 mg | ORAL_TABLET | ORAL | 6 refills | Status: DC | PRN

## 2022-01-13 ENCOUNTER — Encounter: Payer: Self-pay | Admitting: Adult Health

## 2022-01-13 ENCOUNTER — Telehealth (INDEPENDENT_AMBULATORY_CARE_PROVIDER_SITE_OTHER): Payer: Commercial Managed Care - PPO | Admitting: Adult Health

## 2022-01-13 VITALS — Ht 74.5 in | Wt 271.0 lb

## 2022-01-13 DIAGNOSIS — M1A09X0 Idiopathic chronic gout, multiple sites, without tophus (tophi): Secondary | ICD-10-CM | POA: Diagnosis not present

## 2022-01-13 MED ORDER — PREDNISONE 20 MG PO TABS: 20.0000 mg | ORAL_TABLET | Freq: Every day | ORAL | 0 refills | Status: DC

## 2022-01-13 MED ORDER — ALLOPURINOL 300 MG PO TABS: 300.0000 mg | ORAL_TABLET | Freq: Every day | ORAL | 1 refills | Status: DC

## 2022-01-13 NOTE — Progress Notes (Signed)
Virtual Visit via Video Note  I connected with Dustin Orr on 01/13/22 at 11:45 AM EDT by a video enabled telemedicine application and verified that I am speaking with the correct person using two identifiers.  Location patient: home Location provider:work or home office Persons participating in the virtual visit: patient, provider  I discussed the limitations of evaluation and management by telemedicine and the availability of in person appointments. The patient expressed understanding and agreed to proceed.   HPI: 46 year old male who is being evaluated today for an acute on chronic issue.  He has history of gout, currently taking allopurinol 100 mg a day and has been taking this for the last couple weeks.  Unfortunately he developed an acute flare in his right ankle and right toe starting 1 day ago.  Symptoms include pain especially  with ambulation, redness, warmth, and swelling.   ROS: See pertinent positives and negatives per HPI.  Past Medical History:  Diagnosis Date   Asthma    Gout     History reviewed. No pertinent surgical history.  Family History  Problem Relation Age of Onset   Hearing loss Mother    Hearing loss Father    Diabetes Maternal Grandfather    Scoliosis Paternal Grandmother        Current Outpatient Medications:    albuterol (PROVENTIL HFA;VENTOLIN HFA) 108 (90 Base) MCG/ACT inhaler, Inhale 1-2 puffs into the lungs every 6 (six) hours as needed for wheezing or shortness of breath., Disp: , Rfl:    allopurinol (ZYLOPRIM) 100 MG tablet, Take 1 tablet (100 mg total) by mouth daily., Disp: 90 tablet, Rfl: 3   fluticasone (FLOVENT HFA) 110 MCG/ACT inhaler, Inhale 2 puffs into the lungs 2 (two) times daily., Disp: , Rfl:    indomethacin (INDOCIN) 50 MG capsule, Take 50 mg by mouth daily as needed for other., Disp: , Rfl:    loratadine (CLARITIN REDITABS) 10 MG dissolvable tablet, Take by mouth., Disp: , Rfl:    Multiple Vitamin (MULTIVITAMIN WITH  MINERALS) TABS tablet, Take 1 tablet by mouth daily., Disp: , Rfl:    Probiotic Product (PROBIOTIC ADVANCED PO), Take 1 tablet by mouth daily., Disp: , Rfl:    tadalafil (CIALIS) 20 MG tablet, Take 0.5-1 tablets (10-20 mg total) by mouth every other day as needed for erectile dysfunction., Disp: 10 tablet, Rfl: 6  EXAM:  VITALS per patient if applicable:  GENERAL: alert, oriented, appears well and in no acute distress  HEENT: atraumatic, conjunttiva clear, no obvious abnormalities on inspection of external nose and ears  NECK: normal movements of the head and neck  LUNGS: on inspection no signs of respiratory distress, breathing rate appears normal, no obvious gross SOB, gasping or wheezing  CV: no obvious cyanosis  MS: moves all visible extremities without noticeable abnormality  PSYCH/NEURO: pleasant and cooperative, no obvious depression or anxiety, speech and thought processing grossly intact  ASSESSMENT AND PLAN:  Discussed the following assessment and plan:  1. Chronic gout of multiple sites, unspecified cause -We will send in prednisone to help with his acute flare and have him start taking allopurinol 300 mg daily to hopefully prevent future flares.  He can start allopurinol once he is done with the prednisone - predniSONE (DELTASONE) 20 MG tablet; Take 1 tablet (20 mg total) by mouth daily with breakfast.  Dispense: 7 tablet; Refill: 0 - allopurinol (ZYLOPRIM) 300 MG tablet; Take 1 tablet (300 mg total) by mouth daily.  Dispense: 90 tablet; Refill: 1  I discussed the assessment and treatment plan with the patient. The patient was provided an opportunity to ask questions and all were answered. The patient agreed with the plan and demonstrated an understanding of the instructions.   The patient was advised to call back or seek an in-person evaluation if the symptoms worsen or if the condition fails to improve as anticipated.   Dorothyann Peng, NP

## 2022-01-21 LAB — COLOGUARD: COLOGUARD: NEGATIVE

## 2022-02-02 ENCOUNTER — Telehealth: Payer: Self-pay

## 2022-02-02 NOTE — Telephone Encounter (Signed)
Pt states he had a gout flare up yesterday, thinks he may have it under control. Took some ibuprofen, but has now switched over to the allopurinol 300 mg, states if if does not get any better, he will call back to get an appointment.

## 2022-03-02 ENCOUNTER — Ambulatory Visit: Payer: Commercial Managed Care - PPO

## 2022-03-09 ENCOUNTER — Ambulatory Visit: Payer: Commercial Managed Care - PPO

## 2022-03-22 ENCOUNTER — Encounter: Payer: Self-pay | Admitting: Adult Health

## 2022-04-06 ENCOUNTER — Ambulatory Visit (INDEPENDENT_AMBULATORY_CARE_PROVIDER_SITE_OTHER): Payer: Commercial Managed Care - PPO | Admitting: Adult Health

## 2022-04-06 ENCOUNTER — Encounter: Payer: Self-pay | Admitting: Adult Health

## 2022-04-06 VITALS — BP 122/100 | HR 90 | Temp 98.0°F | Ht 74.5 in | Wt 271.0 lb

## 2022-04-06 DIAGNOSIS — M10021 Idiopathic gout, right elbow: Secondary | ICD-10-CM

## 2022-04-06 DIAGNOSIS — M10421 Other secondary gout, right elbow: Secondary | ICD-10-CM

## 2022-04-06 MED ORDER — COLCHICINE 0.6 MG PO TABS: 0.6000 mg | ORAL_TABLET | Freq: Every day | ORAL | 1 refills | Status: DC

## 2022-04-06 MED ORDER — PREDNISONE 20 MG PO TABS: 20.0000 mg | ORAL_TABLET | Freq: Every day | ORAL | 0 refills | Status: DC

## 2022-04-06 NOTE — Progress Notes (Signed)
Subjective:    Patient ID: Dustin Orr, male    DOB: 1976-04-25, 46 y.o.   MRN: [id number]  HPI 46 year old male who  has a past medical history of Asthma and Gout.  He presents to the office today for an acute gout flare of his right elbow. Started about 5 days ago. Possible causative factor was salmon with lemon sauce. Has been taking his Allopurinol 300 mg daily.   Has redness, warmth, swelling and pain with movement to right elbo    Review of Systems See HPI   Past Medical History:  Diagnosis Date   Asthma    Gout     Social History   Socioeconomic History   Marital status: Married    Spouse name: Not on file   Number of children: Not on file   Years of education: Not on file   Highest education level: Not on file  Occupational History   Not on file  Tobacco Use   Smoking status: Never   Smokeless tobacco: Never  Vaping Use   Vaping Use: Never used  Substance and Sexual Activity   Alcohol use: Yes    Alcohol/week: 12.0 standard drinks of alcohol    Types: 6 Glasses of wine, 6 Cans of beer per week    Comment: Weekends   Drug use: No   Sexual activity: Yes  Other Topics Concern   Not on file  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 No pertinent surgical history.  Family History  Problem Relation Age of Onset   Hearing loss Mother    Hearing loss Father    Diabetes Maternal Grandfather    Scoliosis Paternal Grandmother     Allergies  Allergen Reactions   Penicillins Rash    Has patient had a PCN reaction causing immediate rash, facial/tongue/throat swelling, SOB or lightheadedness with hypotension: no Has patient had a PCN reaction causing severe rash  involving mucus membranes or skin necrosis: no Has patient had a PCN reaction that required hospitalization: no Has patient had a PCN reaction occurring within the last 10 years: no If all of the above answers are "NO", then may proceed with Cephalosporin use.     Current Outpatient Medications on File Prior to Visit  Medication Sig Dispense Refill   albuterol (PROVENTIL HFA;VENTOLIN HFA) 108 (90 Base) MCG/ACT inhaler Inhale 1-2 puffs into the lungs every 6 (six) hours as needed for wheezing or shortness of breath.     allopurinol (ZYLOPRIM) 300 MG tablet Take 1 tablet (300 mg total) by mouth daily. 90 tablet 1   fluticasone (FLOVENT HFA) 110 MCG/ACT inhaler Inhale 2 puffs into the lungs 2 (two) times daily.     indomethacin (INDOCIN) 50 MG capsule Take 50 mg by mouth daily as needed for other.     loratadine (CLARITIN REDITABS) 10 MG dissolvable tablet Take by mouth.     Multiple Vitamin (MULTIVITAMIN WITH MINERALS) TABS tablet Take 1 tablet by mouth daily.     Probiotic Product (PROBIOTIC ADVANCED PO) Take 1 tablet by mouth daily.     tadalafil (CIALIS) 20 MG tablet Take 0.5-1 tablets (10-20 mg total) by mouth every other  day as needed for erectile dysfunction. 10 tablet 6   No current facility-administered medications on file prior to visit.    BP (!) 122/100   Pulse 90   Temp 98 F (36.7 C) (Oral)   Ht 6' 2.5" (1.892 m)   Wt 271 lb (122.9 kg)   SpO2 96%   BMI 34.33 kg/m       Objective:   Physical Exam Vitals and nursing note reviewed.  Constitutional:      Appearance: Normal appearance.  Musculoskeletal:        General: Swelling and tenderness present.  Skin:    Capillary Refill: Capillary refill takes less than 2 seconds.     Findings: Erythema present.  Neurological:     General: No focal deficit present.     Mental Status: He is alert and oriented to person, place, and time.  Psychiatric:        Mood and Affect: Mood normal.        Behavior: Behavior normal.         Thought Content: Thought content normal.        Judgment: Judgment normal.       Assessment & Plan:  1. Other secondary acute gout of right elbow  - predniSONE (DELTASONE) 20 MG tablet; Take 1 tablet (20 mg total) by mouth daily with breakfast.  Dispense: 7 tablet; Refill: 0 - colchicine 0.6 MG tablet; Take 1 tablet (0.6 mg total) by mouth daily. Take two tabs on first day of acute flare and then one tab daily until flare has resolved  Dispense: 30 tablet; Refill: 1   Shirline Frees, NP

## 2022-06-10 ENCOUNTER — Encounter: Payer: Self-pay | Admitting: Adult Health

## 2022-06-16 ENCOUNTER — Other Ambulatory Visit: Payer: Self-pay | Admitting: Adult Health

## 2022-06-16 DIAGNOSIS — M10421 Other secondary gout, right elbow: Secondary | ICD-10-CM

## 2022-06-17 MED ORDER — PREDNISONE 20 MG PO TABS: 20.0000 mg | ORAL_TABLET | Freq: Every day | ORAL | 0 refills | Status: DC

## 2022-06-26 ENCOUNTER — Other Ambulatory Visit: Payer: Self-pay | Admitting: Adult Health

## 2022-06-26 DIAGNOSIS — M1A09X0 Idiopathic chronic gout, multiple sites, without tophus (tophi): Secondary | ICD-10-CM

## 2022-12-11 ENCOUNTER — Other Ambulatory Visit: Payer: Self-pay | Admitting: Adult Health

## 2022-12-11 DIAGNOSIS — M1A09X0 Idiopathic chronic gout, multiple sites, without tophus (tophi): Secondary | ICD-10-CM

## 2022-12-11 DIAGNOSIS — M10421 Other secondary gout, right elbow: Secondary | ICD-10-CM

## 2022-12-26 IMAGING — CT CT HEAD W/O CM
4 series · 15 of 47 positions shown, 17 images · non-contrast
Comparison: None.

CLINICAL DATA: Facial trauma, blunt facial trauma; Head trauma,
moderate-severe head injury

EXAM:
CT HEAD WITHOUT CONTRAST
CT MAXILLOFACIAL WITHOUT CONTRAST
TECHNIQUE: Multidetector CT imaging of the head and maxillofacial structures
were performed using the standard protocol without intravenous
contrast. Multiplanar CT image reconstructions of the maxillofacial
structures were also generated.
RADIATION DOSE REDUCTION: This exam was performed according to the
departmental dose-optimization program which includes automated
exposure control, adjustment of the mA and/or kV according to
patient size and/or use of iterative reconstruction technique.

[Series 2: head wo · axial · 0.46mm/px · z∈[+745,+865]mm · 7 of 33 slices shown, 9 images]
[im 5/33  brain]
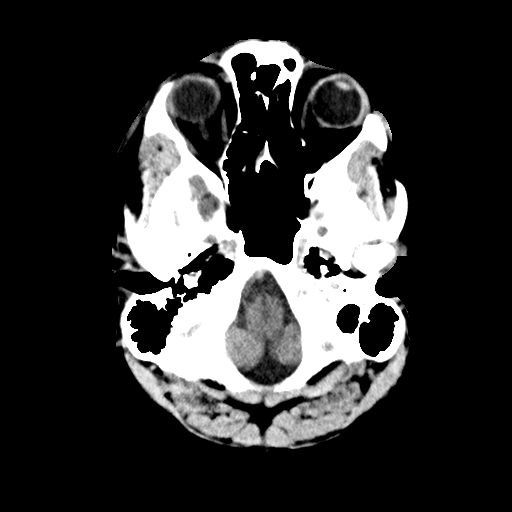
[im 5/33  bone]
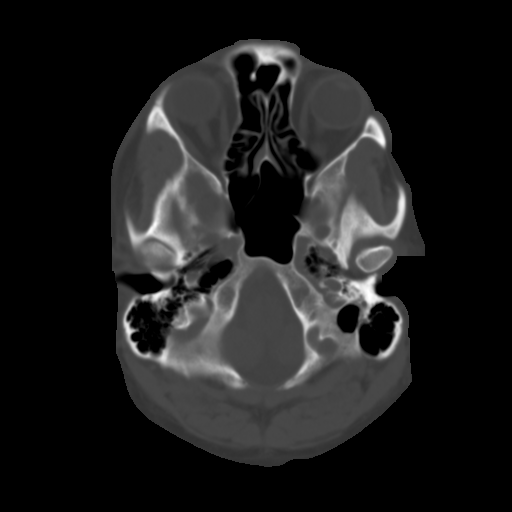
[im 9/33  brain]
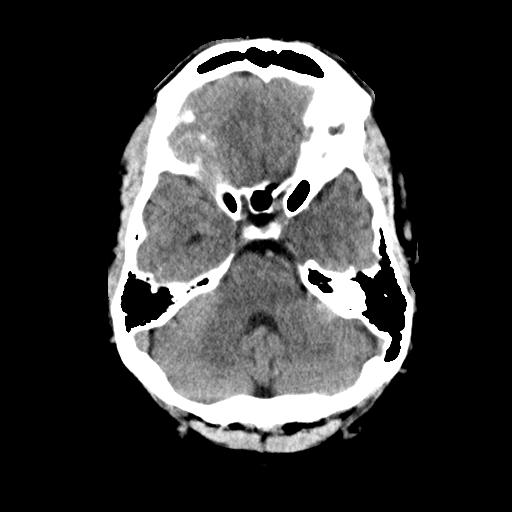
[im 13/33  brain]
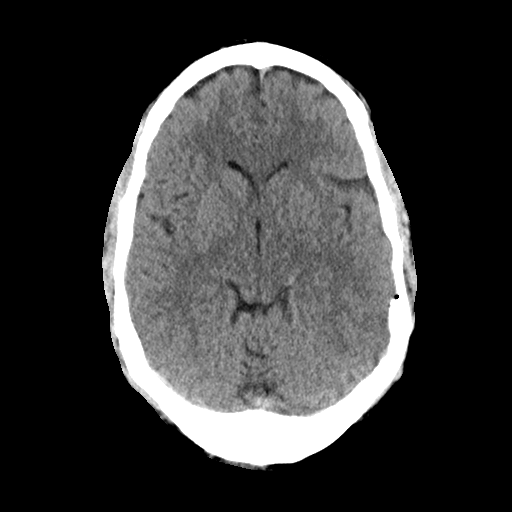
[im 17/33  brain]
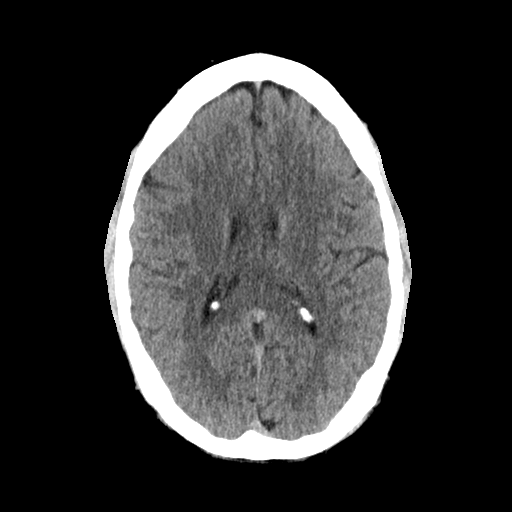
[im 21/33  brain]
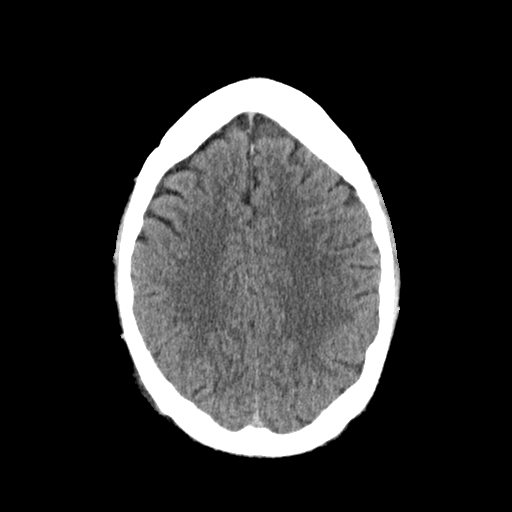
[im 21/33  bone]
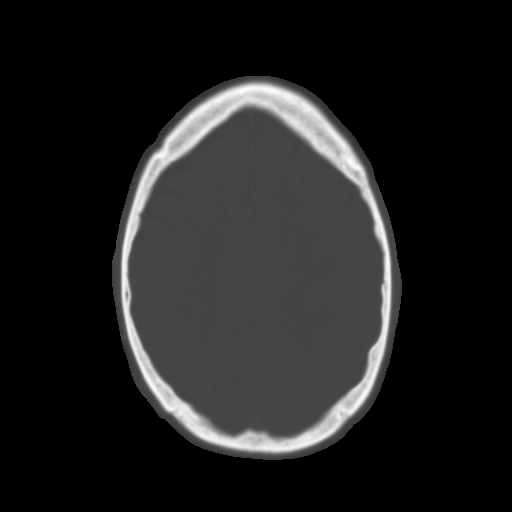
[im 25/33  brain]
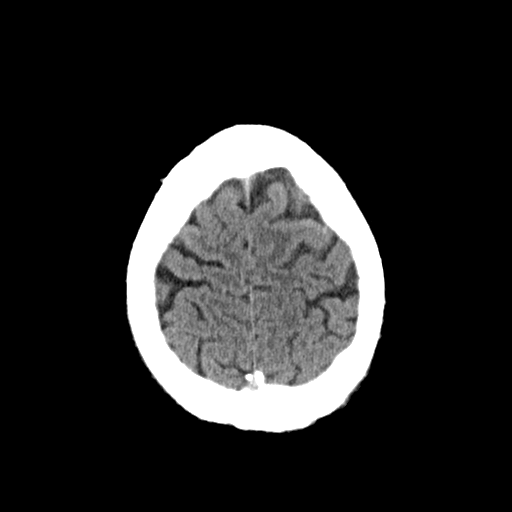
[im 29/33  brain]
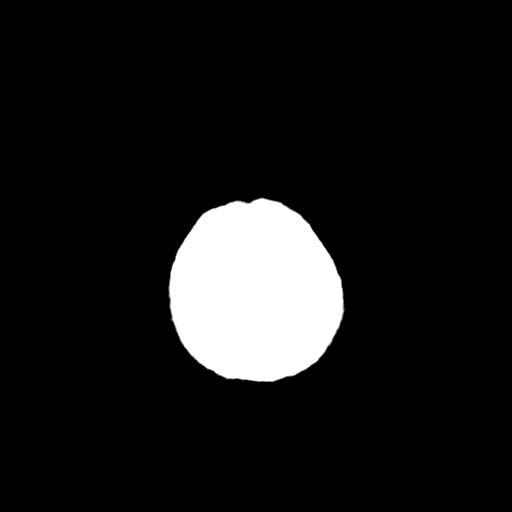

[Series 3: head bone · axial · 0.46mm/px · z∈[+741,+757]mm · 2 of 82 slices shown]
[im 9/82  bone]
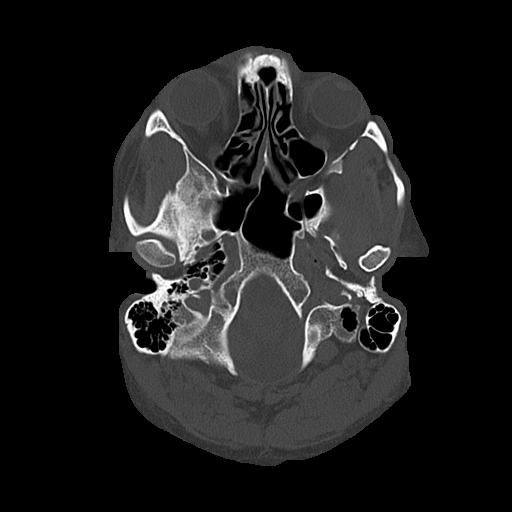
[im 17/82  bone]
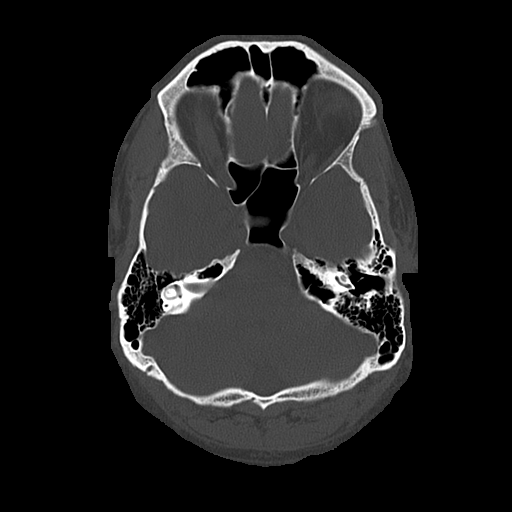

[Series 4: coronal soft · coronal · 0.33mm/px · 3 of 75 slices shown]
[im 25/75  brain]
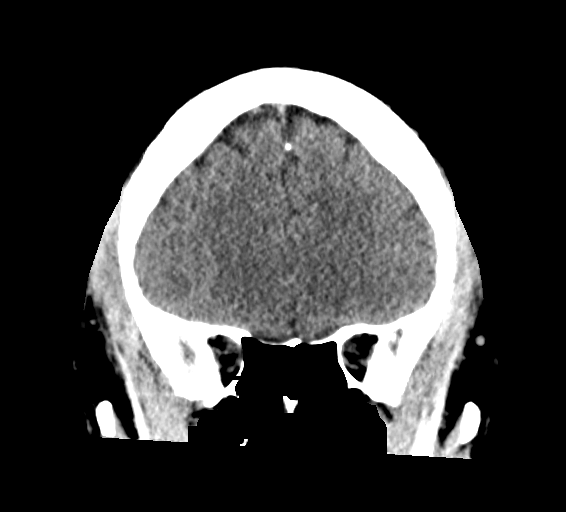
[im 33/75  brain]
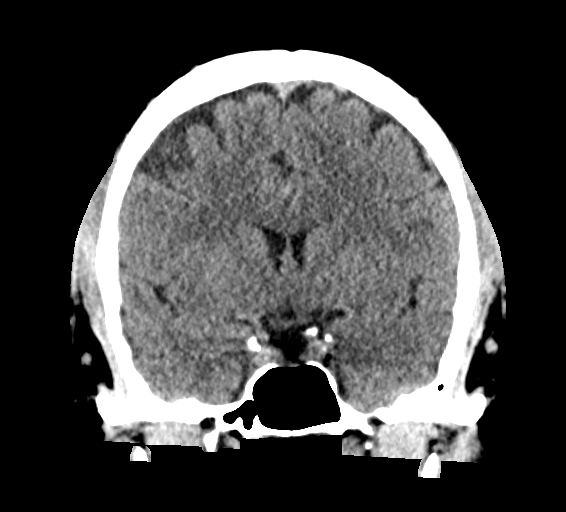
[im 42/75  brain]
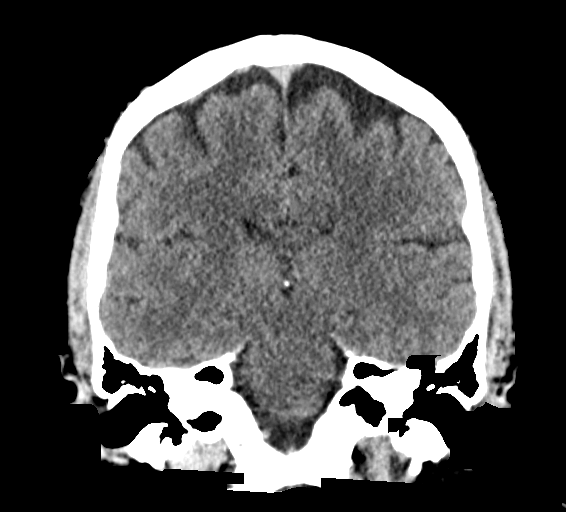

[Series 5: sagittal soft · sagittal · 0.33mm/px · 3 of 64 slices shown]
[im 22/64  brain]
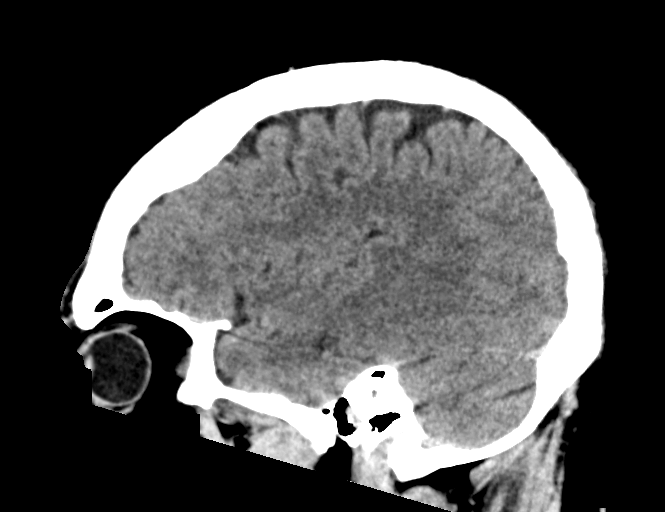
[im 32/64  brain]
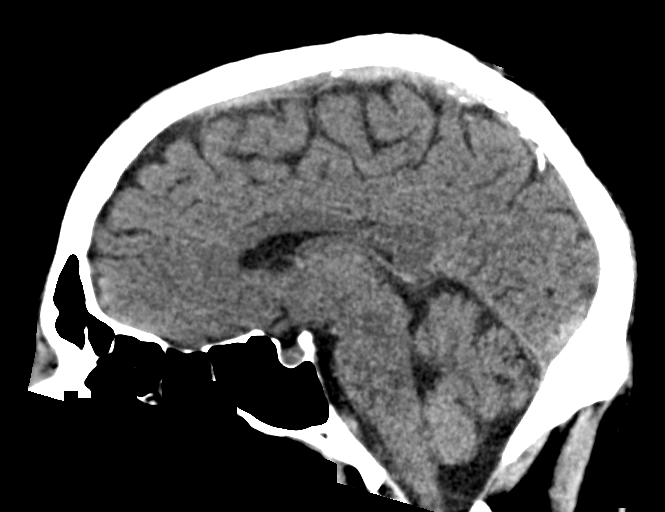
[im 43/64  brain]
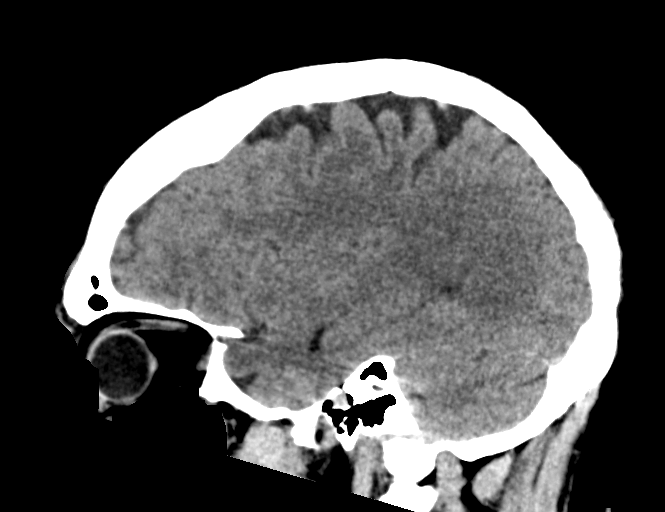

[15 of 47 positions shown; findings below may reference images not displayed]

FINDINGS: CT HEAD FINDINGS

Brain:

No evidence of large-territorial acute infarction. No parenchymal
hemorrhage. No mass lesion. No extra-axial collection.

No mass effect or midline shift. No hydrocephalus. Basilar cisterns
are patent.

Vascular: No hyperdense vessel.

Skull: No acute fracture or focal lesion.

Other: Mild bilateral frontal scalp subcutaneus soft tissue edema.

CT MAXILLOFACIAL FINDINGS

Osseous: Bilateral minimally displaced comminuted fractures of the
nasal bones as well as comminuted fracture of the nasal septum with
6mm leftward deviation.

Sinuses/Orbits: Subcutaneus soft tissue edema of the maxillary
sinuses. Paranasal sinuses and mastoid air cells are clear. The
orbits are unremarkable.

Soft tissues: Left periorbital subcutaneus soft tissue a edema and
small hematoma formation. Right periorbital subcutaneus soft tissue
edema and small hematoma formation with extension along the right
maxillary subcutaneus soft tissues. Nasal subcutaneus soft tissue
edema.
IMPRESSION: 1. No acute intracranial abnormality.
2. Bilateral minimally displaced comminuted fractures of the nasal
bones as well as comminuted fracture of the nasal septum with 6mm
leftward deviation.

## 2022-12-26 IMAGING — CT CT MAXILLOFACIAL W/O CM
3 series · 15 of 47 positions shown, 18 images · non-contrast
Comparison: None.

CLINICAL DATA: Facial trauma, blunt facial trauma; Head trauma,
moderate-severe head injury

EXAM:
CT HEAD WITHOUT CONTRAST
CT MAXILLOFACIAL WITHOUT CONTRAST
TECHNIQUE: Multidetector CT imaging of the head and maxillofacial structures
were performed using the standard protocol without intravenous
contrast. Multiplanar CT image reconstructions of the maxillofacial
structures were also generated.
RADIATION DOSE REDUCTION: This exam was performed according to the
departmental dose-optimization program which includes automated
exposure control, adjustment of the mA and/or kV according to
patient size and/or use of iterative reconstruction technique.

[Series 2: 1 max soft · axial · 0.35mm/px · z∈[+620,+770]mm · 9 of 89 slices shown, 12 images]
[im 7/89  brain]
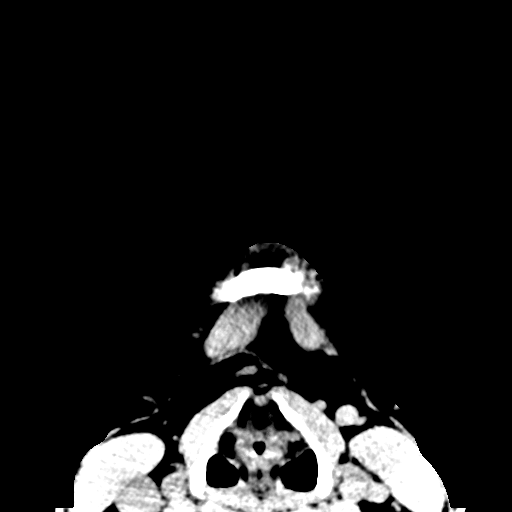
[im 7/89  bone]
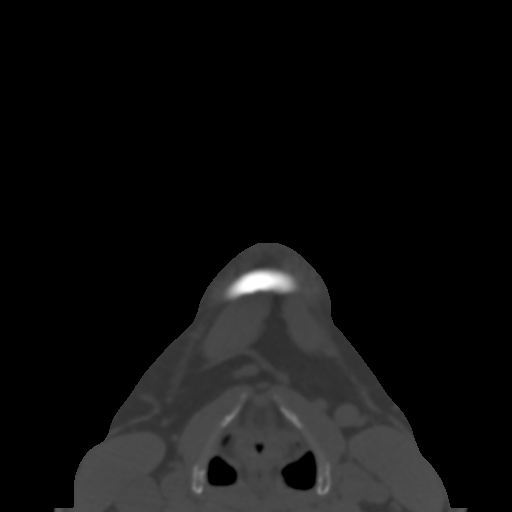
[im 16/89  bone]
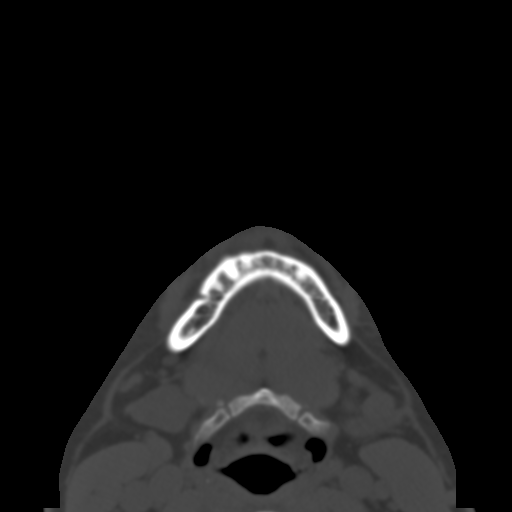
[im 25/89  bone]
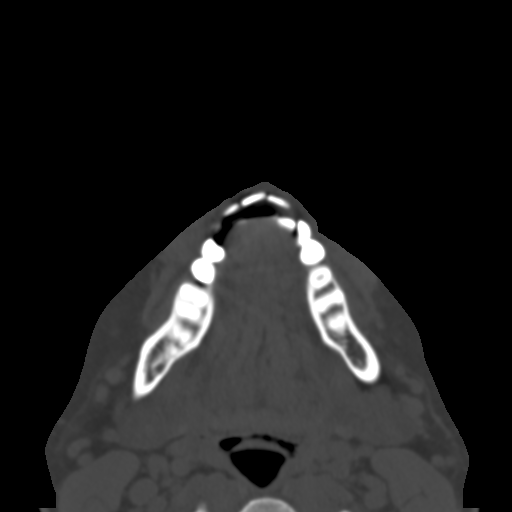
[im 34/89  bone]
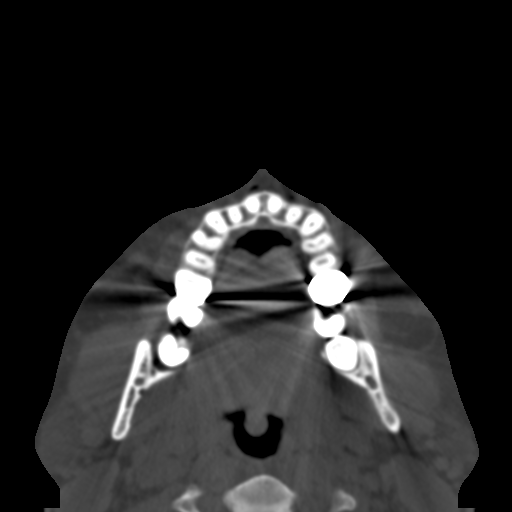
[im 46/89  brain]
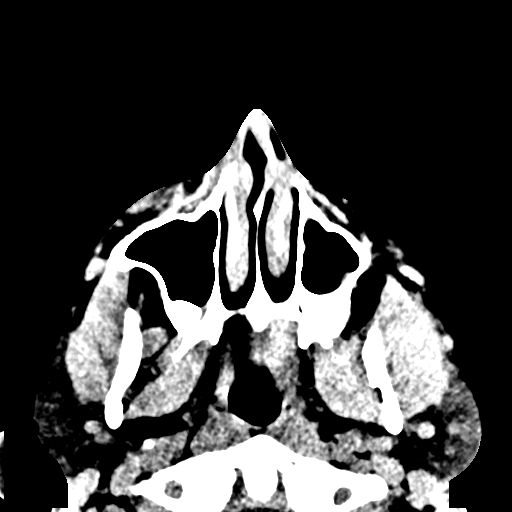
[im 46/89  bone]
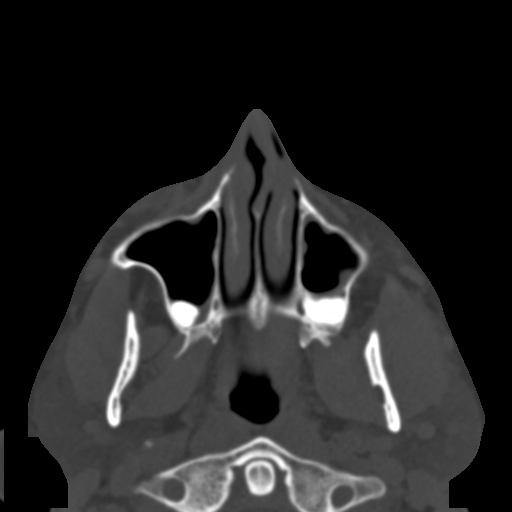
[im 55/89  bone]
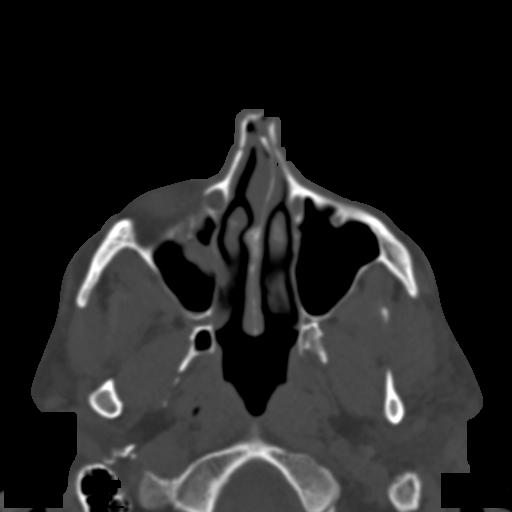
[im 64/89  bone]
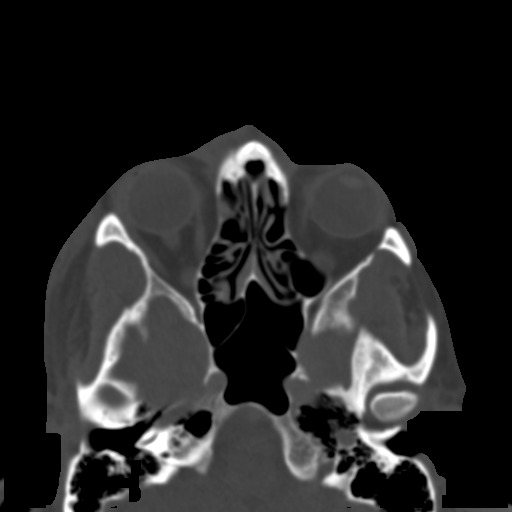
[im 73/89  bone]
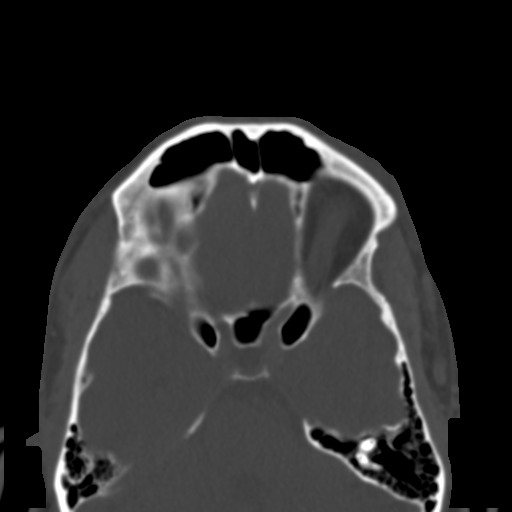
[im 82/89  brain]
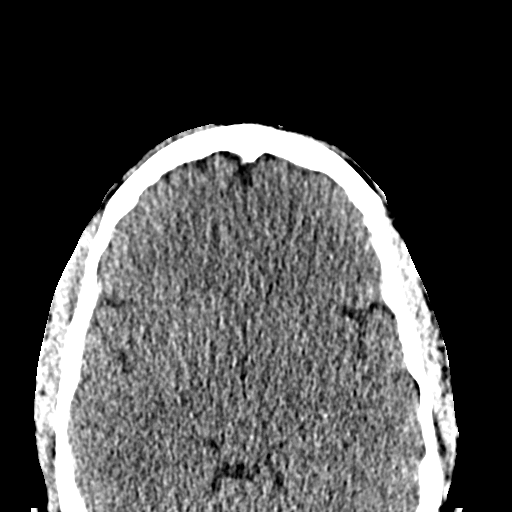
[im 82/89  bone]
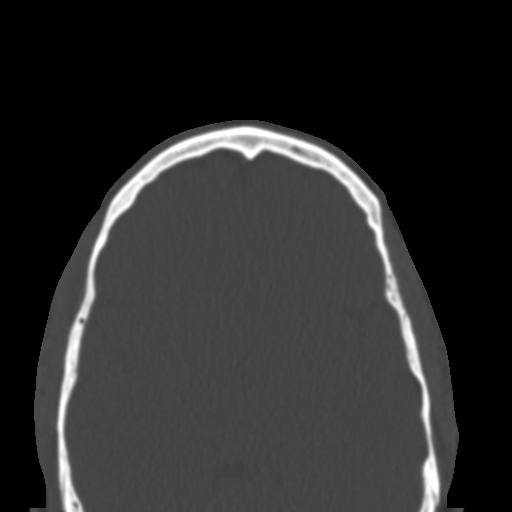

[Series 6: coronal soft · coronal · 0.36mm/px · 3 of 85 slices shown]
[im 29/85  bone]
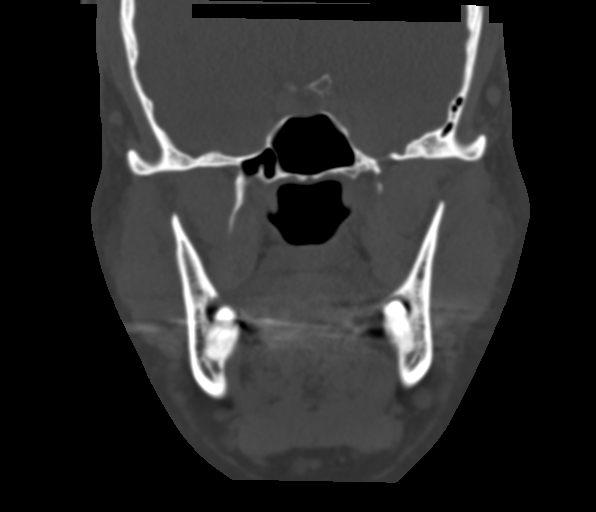
[im 38/85  bone]
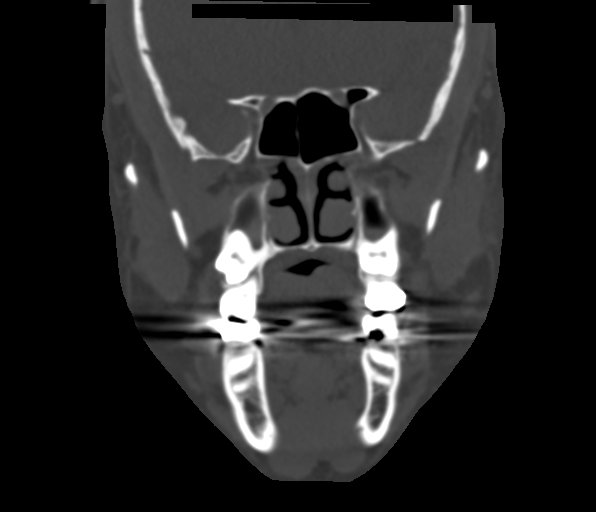
[im 47/85  bone]
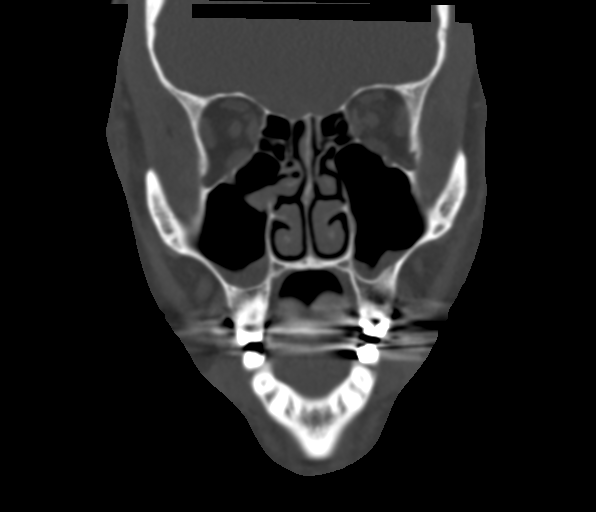

[Series 7: sagittal soft · sagittal · 0.33mm/px · 3 of 107 slices shown]
[im 36/107  bone]
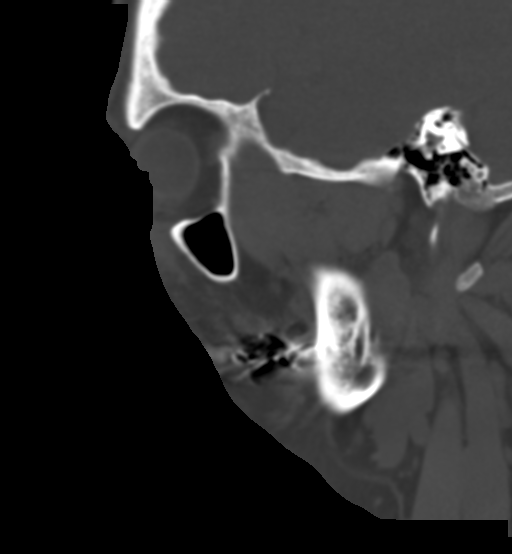
[im 54/107  bone]
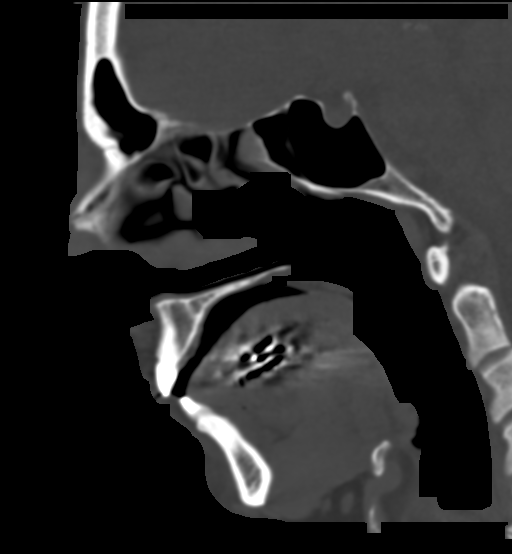
[im 71/107  bone]
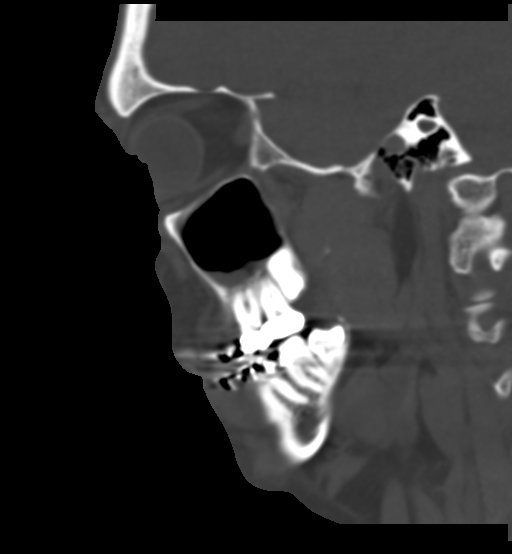

[15 of 47 positions shown; findings below may reference images not displayed]

FINDINGS: CT HEAD FINDINGS

Brain:

No evidence of large-territorial acute infarction. No parenchymal
hemorrhage. No mass lesion. No extra-axial collection.

No mass effect or midline shift. No hydrocephalus. Basilar cisterns
are patent.

Vascular: No hyperdense vessel.

Skull: No acute fracture or focal lesion.

Other: Mild bilateral frontal scalp subcutaneus soft tissue edema.

CT MAXILLOFACIAL FINDINGS

Osseous: Bilateral minimally displaced comminuted fractures of the
nasal bones as well as comminuted fracture of the nasal septum with
6mm leftward deviation.

Sinuses/Orbits: Subcutaneus soft tissue edema of the maxillary
sinuses. Paranasal sinuses and mastoid air cells are clear. The
orbits are unremarkable.

Soft tissues: Left periorbital subcutaneus soft tissue a edema and
small hematoma formation. Right periorbital subcutaneus soft tissue
edema and small hematoma formation with extension along the right
maxillary subcutaneus soft tissues. Nasal subcutaneus soft tissue
edema.
IMPRESSION: 1. No acute intracranial abnormality.
2. Bilateral minimally displaced comminuted fractures of the nasal
bones as well as comminuted fracture of the nasal septum with 6mm
leftward deviation.

## 2023-01-17 ENCOUNTER — Other Ambulatory Visit: Payer: Self-pay | Admitting: Adult Health

## 2023-01-17 DIAGNOSIS — M1A09X0 Idiopathic chronic gout, multiple sites, without tophus (tophi): Secondary | ICD-10-CM

## 2023-01-20 ENCOUNTER — Other Ambulatory Visit: Payer: Self-pay | Admitting: Adult Health

## 2023-01-20 NOTE — Telephone Encounter (Signed)
Patient need to schedule CPE for more refills.

## 2023-01-25 ENCOUNTER — Encounter: Payer: Self-pay | Admitting: Adult Health

## 2023-02-11 ENCOUNTER — Ambulatory Visit: Payer: BC Managed Care – PPO | Admitting: Adult Health

## 2023-02-11 ENCOUNTER — Encounter: Payer: Self-pay | Admitting: Adult Health

## 2023-02-11 VITALS — BP 150/80 | HR 107 | Temp 98.1°F | Wt 269.8 lb

## 2023-02-11 DIAGNOSIS — N529 Male erectile dysfunction, unspecified: Secondary | ICD-10-CM

## 2023-02-11 DIAGNOSIS — E66811 Obesity, class 1: Secondary | ICD-10-CM

## 2023-02-11 DIAGNOSIS — Z0000 Encounter for general adult medical examination without abnormal findings: Secondary | ICD-10-CM

## 2023-02-11 DIAGNOSIS — J4530 Mild persistent asthma, uncomplicated: Secondary | ICD-10-CM | POA: Diagnosis not present

## 2023-02-11 DIAGNOSIS — R7303 Prediabetes: Secondary | ICD-10-CM

## 2023-02-11 DIAGNOSIS — M1A09X0 Idiopathic chronic gout, multiple sites, without tophus (tophi): Secondary | ICD-10-CM

## 2023-02-11 DIAGNOSIS — Z125 Encounter for screening for malignant neoplasm of prostate: Secondary | ICD-10-CM

## 2023-02-11 DIAGNOSIS — R030 Elevated blood-pressure reading, without diagnosis of hypertension: Secondary | ICD-10-CM

## 2023-02-11 LAB — CBC
HCT: 47.2 % (ref 39.0–52.0)
Hemoglobin: 15.8 g/dL (ref 13.0–17.0)
MCHC: 33.5 g/dL (ref 30.0–36.0)
MCV: 91.7 fL (ref 78.0–100.0)
Platelets: 226 10*3/uL (ref 150.0–400.0)
RBC: 5.14 Mil/uL (ref 4.22–5.81)
RDW: 13.6 % (ref 11.5–15.5)
WBC: 6.8 10*3/uL (ref 4.0–10.5)

## 2023-02-11 LAB — COMPREHENSIVE METABOLIC PANEL
ALT: 84 U/L — ABNORMAL HIGH (ref 0–53)
AST: 53 U/L — ABNORMAL HIGH (ref 0–37)
Albumin: 4.6 g/dL (ref 3.5–5.2)
Alkaline Phosphatase: 113 U/L (ref 39–117)
BUN: 15 mg/dL (ref 6–23)
CO2: 29 meq/L (ref 19–32)
Calcium: 9.6 mg/dL (ref 8.4–10.5)
Chloride: 103 meq/L (ref 96–112)
Creatinine, Ser: 1.08 mg/dL (ref 0.40–1.50)
GFR: 81.63 mL/min (ref >60.00)
Glucose, Bld: 102 mg/dL — ABNORMAL HIGH (ref 70–99)
Potassium: 4.2 meq/L (ref 3.5–5.1)
Sodium: 140 meq/L (ref 135–145)
Total Bilirubin: 0.7 mg/dL (ref 0.2–1.2)
Total Protein: 6.8 g/dL (ref 6.0–8.3)

## 2023-02-11 LAB — URIC ACID: Uric Acid, Serum: 5 mg/dL (ref 4.0–7.8)

## 2023-02-11 LAB — PSA: PSA: 0.18 ng/mL (ref 0.10–4.00)

## 2023-02-11 LAB — HEMOGLOBIN A1C: Hgb A1c MFr Bld: 5.8 % (ref 4.6–6.5)

## 2023-02-11 LAB — TSH: TSH: 2.75 u[IU]/mL (ref 0.35–5.50)

## 2023-02-11 MED ORDER — ALLOPURINOL 300 MG PO TABS: 300.0000 mg | ORAL_TABLET | Freq: Every day | ORAL | 3 refills | Status: DC

## 2023-02-11 MED ORDER — ALBUTEROL SULFATE HFA 108 (90 BASE) MCG/ACT IN AERS: 1.0000 | INHALATION_SPRAY | Freq: Four times a day (QID) | RESPIRATORY_TRACT | 0 refills | Status: DC | PRN

## 2023-02-11 MED ORDER — FLUTICASONE PROPIONATE HFA 110 MCG/ACT IN AERO: 2.0000 | INHALATION_SPRAY | Freq: Two times a day (BID) | RESPIRATORY_TRACT | 1 refills | Status: DC

## 2023-02-11 MED ORDER — TADALAFIL 20 MG PO TABS: 10.0000 mg | ORAL_TABLET | ORAL | 6 refills | Status: DC | PRN

## 2023-02-11 NOTE — Patient Instructions (Addendum)
It was great seeing you today   We will follow up with you regarding your lab work   Please let me know if you need anything   Check your blood pressure at home three times a day for the next 2 weeks and send me your results via mychart.   Allergy and asthma will call you

## 2023-02-11 NOTE — Progress Notes (Signed)
Subjective:    Patient ID: Dustin Orr, male    DOB: [birth year], 47 y.o.   MRN: [id number]  HPI Patient presents for yearly preventative medicine examination. He is a 47 year old male who  has a past medical history of Asthma and Gout.  Asthma-is seen by allergy and asthma at [facility].  Currently controlled on Flovent 2 puffs twice daily as needed and albuterol 2 puffs every 6 hours as needed.  He reports that his symptoms are generally well controlled and he typically needs Flovent in the morning and maybe uses his rescue inhaler once a week. He would like to be referred to [facility] Allergy and Asthma. He has not been able to get an appointment with [facility] and is in need of his inhalers.   Gout-takes allopurinol 300 mg daily. He denies any recent gout flares.   ED - currently prescribed Cialis 20 mg daily PRN  Prediabetes - not currently on medication  Lab Results  Component Value Date   HGBA1C 5.8 01/01/2022   BP Readings from Last 3 Encounters:  02/11/23 (!) 150/80  04/06/22 (!) 122/100  01/01/22 128/86   All immunizations and health maintenance protocols were reviewed with the patient and needed orders were placed.  Appropriate screening laboratory values were ordered for the patient including screening of hyperlipidemia, renal function and hepatic function. If indicated by BPH, a PSA was ordered.  Medication reconciliation,  past medical history, social history, problem list and allergies were reviewed in detail with the patient  Goals were established with regard to weight loss, exercise, and  diet in compliance with medications Wt Readings from Last 3 Encounters:  02/11/23 269 lb 12.8 oz (122.4 kg)  04/06/22 271 lb (122.9 kg)  01/13/22 271 lb (122.9 kg)   He is up to date on routine colon cancer screening   Review of Systems  Constitutional: Negative.   HENT: Negative.    Eyes: Negative.   Respiratory: Negative.    Cardiovascular: Negative.    Gastrointestinal: Negative.   Endocrine: Negative.   Genitourinary: Negative.   Musculoskeletal: Negative.   Skin: Negative.   Allergic/Immunologic: Negative.   Neurological: Negative.   Hematological: Negative.   Psychiatric/Behavioral: Negative.    All other systems reviewed and are negative.  Past Medical History:  Diagnosis Date   Asthma    Gout     Social History   Socioeconomic History   Marital status: Married    Spouse name: Not on file   Number of children: Not on file   Years of education: Not on file   Highest education level: Not on file  Occupational History   Not on file  Tobacco Use   Smoking status: Never   Smokeless tobacco: Never  Vaping Use   Vaping status: Never Used  Substance and Sexual Activity   Alcohol use: Yes    Alcohol/week: 12.0 standard drinks of alcohol    Types: 6 Glasses of wine, 6 Cans of beer per week    Comment: Weekends   Drug use: No   Sexual activity: Yes  Other Topics Concern   Not on file  Social History Narrative   Art gallery manager    Social Determinants of Health   Financial Resource Strain: Patient Declined (02/10/2023)   Overall Financial Resource Strain (CARDIA)    Difficulty of Paying Living Expenses: Patient declined  Food Insecurity: Patient Declined (02/10/2023)   Hunger Vital Sign    Worried About Running Out of Food in the  Last Year: Patient declined    Ran Out of Food in the Last Year: Patient declined  Transportation Needs: Patient Declined (02/10/2023)   PRAPARE - Administrator, Civil Service (Medical): Patient declined    Lack of Transportation (Non-Medical): Patient declined  Physical Activity: Sufficiently Active (02/10/2023)   Exercise Vital Sign    Days of Exercise per Week: 6 days    Minutes of Exercise per Session: 50 min  Stress: Stress Concern Present (02/10/2023)   [employer] of Occupational Health - Occupational Stress Questionnaire    Feeling of Stress : To some extent   Social Connections: Unknown (02/10/2023)   Social Connection and Isolation Panel [NHANES]    Frequency of Communication with Friends and Family: More than three times a week    Frequency of Social Gatherings with Friends and Family: Patient declined    Attends Religious Services: Patient declined    Database administrator or Organizations: Yes    Attends Engineer, structural: More than 4 times per year    Marital Status: Married  Catering manager Violence: Not on file    History reviewed. No pertinent surgical history.  Family History  Problem Relation Age of Onset   Hearing loss Mother    Hearing loss Father    Diabetes Maternal Grandfather    Scoliosis Paternal Grandmother     Allergies  Allergen Reactions   Penicillins Rash    Has patient had a PCN reaction causing immediate rash, facial/tongue/throat swelling, SOB or lightheadedness with hypotension: no Has patient had a PCN reaction causing severe rash involving mucus membranes or skin necrosis: no Has patient had a PCN reaction that required hospitalization: no Has patient had a PCN reaction occurring within the last 10 years: no If all of the above answers are "NO", then may proceed with Cephalosporin use.     Current Outpatient Medications on File Prior to Visit  Medication Sig Dispense Refill   albuterol (PROVENTIL HFA;VENTOLIN HFA) 108 (90 Base) MCG/ACT inhaler Inhale 1-2 puffs into the lungs every 6 (six) hours as needed for wheezing or shortness of breath.     allopurinol (ZYLOPRIM) 300 MG tablet TAKE 1 TABLET BY MOUTH EVERY DAY 90 tablet 1   colchicine 0.6 MG tablet TAKE 1 TABLET BY MOUTH DAILY. TAKE TWO TABS ON 1ST DAY OF ACUTE FLARE AND THEN 1 TAB DAILY UNTIL FLARE HAS RESOLVED 30 tablet 1   fluticasone (FLOVENT HFA) 110 MCG/ACT inhaler Inhale 2 puffs into the lungs 2 (two) times daily.     indomethacin (INDOCIN) 50 MG capsule Take 50 mg by mouth daily as needed for other.     loratadine (CLARITIN  REDITABS) 10 MG dissolvable tablet Take by mouth.     Multiple Vitamin (MULTIVITAMIN WITH MINERALS) TABS tablet Take 1 tablet by mouth daily.     Probiotic Product (PROBIOTIC ADVANCED PO) Take 1 tablet by mouth daily.     tadalafil (CIALIS) 20 MG tablet Take 0.5-1 tablets (10-20 mg total) by mouth every other day as needed for erectile dysfunction. 10 tablet 6   No current facility-administered medications on file prior to visit.    BP (!) 150/80   Pulse (!) 107   Temp 98.1 F (36.7 C)   Wt 269 lb 12.8 oz (122.4 kg)   SpO2 97%   BMI 34.18 kg/m       Objective:   Physical Exam Vitals and nursing note reviewed.  Constitutional:      General: He  is not in acute distress.    Appearance: Normal appearance. He is obese. He is not ill-appearing.  HENT:     Head: Normocephalic and atraumatic.     Right Ear: Tympanic membrane, ear canal and external ear normal. There is no impacted cerumen.     Left Ear: Tympanic membrane, ear canal and external ear normal. There is no impacted cerumen.     Nose: Nose normal. No congestion or rhinorrhea.     Mouth/Throat:     Mouth: Mucous membranes are moist.     Pharynx: Oropharynx is clear.  Eyes:     Extraocular Movements: Extraocular movements intact.     Conjunctiva/sclera: Conjunctivae normal.     Pupils: Pupils are equal, round, and reactive to light.  Neck:     Vascular: No carotid bruit.  Cardiovascular:     Rate and Rhythm: Normal rate and regular rhythm.     Pulses: Normal pulses.     Heart sounds: No murmur heard.    No friction rub. No gallop.  Pulmonary:     Effort: Pulmonary effort is normal.     Breath sounds: Normal breath sounds.  Abdominal:     General: Abdomen is flat. Bowel sounds are normal. There is no distension.     Palpations: Abdomen is soft. There is no mass.     Tenderness: There is no abdominal tenderness. There is no guarding or rebound.     Hernia: No hernia is present.  Musculoskeletal:        General:  Normal range of motion.     Cervical back: Normal range of motion and neck supple.  Lymphadenopathy:     Cervical: No cervical adenopathy.  Skin:    General: Skin is warm and dry.     Capillary Refill: Capillary refill takes less than 2 seconds.  Neurological:     General: No focal deficit present.     Mental Status: He is alert and oriented to person, place, and time.  Psychiatric:        Mood and Affect: Mood normal.        Behavior: Behavior normal.        Thought Content: Thought content normal.        Judgment: Judgment normal.       Assessment & Plan:  1. Routine general medical examination at a health care facility Today patient counseled on age appropriate routine health concerns for screening and prevention, each reviewed and up to date or declined. Immunizations reviewed and up to date or declined. Labs ordered and reviewed. Risk factors for depression reviewed and negative. Hearing function and visual acuity are intact. ADLs screened and addressed as needed. Functional ability and level of safety reviewed and appropriate. Education, counseling and referrals performed based on assessed risks today. Patient provided with a copy of personalized plan for preventive services.   2. Chronic gout of multiple sites, unspecified cause  - TSH; Future - CBC; Future - Comprehensive metabolic panel; Future - Uric Acid; Future - allopurinol (ZYLOPRIM) 300 MG tablet; Take 1 tablet (300 mg total) by mouth daily.  Dispense: 90 tablet; Refill: 3  3. Mild persistent asthma without complication  - TSH; Future - CBC; Future - Comprehensive metabolic panel; Future - Ambulatory referral to Allergy - fluticasone (FLOVENT HFA) 110 MCG/ACT inhaler; Inhale 2 puffs into the lungs 2 (two) times daily.  Dispense: 1 each; Refill: 1 - albuterol (VENTOLIN HFA) 108 (90 Base) MCG/ACT inhaler; Inhale 1-2 puffs into the lungs  every 6 (six) hours as needed for wheezing or shortness of breath.  Dispense: 18  g; Refill: 0  4. Erectile dysfunction, unspecified erectile dysfunction type - Continue with cialsis  - TSH; Future - CBC; Future - Comprehensive metabolic panel; Future  5. Class 1 obesity - Encouraged lifestyle modifications  - TSH; Future - CBC; Future - Comprehensive metabolic panel; Future  6. Prediabetes - Consider metformin  - TSH; Future - CBC; Future - Comprehensive metabolic panel; Future - Hemoglobin A1c; Future  7. Prostate cancer screening  - PSA; Future  8. Elevated blood pressure reading - Will have him check BP at home over the next two weeks and send me results via mychart   Shirline Frees, NP

## 2023-02-16 ENCOUNTER — Other Ambulatory Visit: Payer: Self-pay | Admitting: Adult Health

## 2023-02-16 DIAGNOSIS — R748 Abnormal levels of other serum enzymes: Secondary | ICD-10-CM

## 2023-02-25 NOTE — Telephone Encounter (Signed)
This has been taking care of.

## 2023-03-08 ENCOUNTER — Ambulatory Visit
Admission: RE | Admit: 2023-03-08 | Discharge: 2023-03-08 | Disposition: A | Discharge status: Home / Self Care | Payer: BC Managed Care – PPO | Source: Ambulatory Visit | Attending: Adult Health | Admitting: Adult Health

## 2023-03-08 DIAGNOSIS — R748 Abnormal levels of other serum enzymes: Secondary | ICD-10-CM

## 2023-03-10 ENCOUNTER — Ambulatory Visit: Payer: BC Managed Care – PPO | Admitting: Allergy

## 2023-03-10 ENCOUNTER — Encounter: Payer: Self-pay | Admitting: Allergy

## 2023-03-10 VITALS — BP 142/98 | HR 76 | Temp 98.0°F | Resp 18 | Ht 73.5 in | Wt 274.2 lb

## 2023-03-10 DIAGNOSIS — J4540 Moderate persistent asthma, uncomplicated: Secondary | ICD-10-CM | POA: Diagnosis not present

## 2023-03-10 DIAGNOSIS — Z880 Allergy status to penicillin: Secondary | ICD-10-CM | POA: Diagnosis not present

## 2023-03-10 DIAGNOSIS — R030 Elevated blood-pressure reading, without diagnosis of hypertension: Secondary | ICD-10-CM | POA: Diagnosis not present

## 2023-03-10 DIAGNOSIS — J3089 Other allergic rhinitis: Secondary | ICD-10-CM | POA: Diagnosis not present

## 2023-03-10 MED ORDER — AIRSUPRA 90-80 MCG/ACT IN AERO: 2.0000 | INHALATION_SPRAY | RESPIRATORY_TRACT | 2 refills | Status: DC | PRN

## 2023-03-10 MED ORDER — RYALTRIS 665-25 MCG/ACT NA SUSP
1.0000 | Freq: Two times a day (BID) | NASAL | 5 refills | Status: DC
Start: 2023-03-10 — End: 2023-03-11

## 2023-03-10 MED ORDER — FLUTICASONE FUROATE-VILANTEROL 100-25 MCG/ACT IN AEPB: 1.0000 | INHALATION_SPRAY | Freq: Every day | RESPIRATORY_TRACT | 3 refills | Status: DC

## 2023-03-10 NOTE — Progress Notes (Signed)
New Patient Note  RE: Dustin Orr MRN: [id number] DOB: 10/28/1975 Date of Office Visit: 03/10/2023  Consult requested by: Shirline Frees, NP Primary care provider: Shirline Frees, NP  Chief Complaint: Asthma (Haven't been able to get his old allergist an appointment. Has been using the albuterol way too much. Wakes up in the morning and has hard time to breathe. Has previously been on advair and singulair. /) and Allergic Rhinitis   History of Present Illness: I had the pleasure of seeing Dustin Orr for initial evaluation at the [facility] on 03/10/2023. He is a 47 y.o. male, who is referred here by Shirline Frees, NP for the evaluation of asthma.  Discussed the use of AI scribe software for clinical note transcription with the patient, who gave verbal consent to proceed.  The patient, with a longstanding history of asthma and allergies, presents with concerns about his current asthma management. He reports dissatisfaction with his current regimen of Flovent and albuterol, citing persistent symptoms and a desire for better control. The patient describes episodes of wheezing, particularly in the mornings, which are temporarily relieved by albuterol. However, the relief is short-lived, and the wheezing returns within a few hours. He also reports shortness of breath but denies any associated coughing or nocturnal symptoms.  The patient's asthma has been a lifelong condition, with a duration of over forty years. He has been on his current regimen of Flovent , two puffs once daily, and albuterol as needed, for at least five years. Prior to this, he was on Advair, which he found effective but difficult to manage due to perceived dependency and insurance issues.  In addition to asthma, the patient has a significant history of allergies, with symptoms including itchy, watery, and runny eyes, and mucus buildup. These symptoms are particularly prominent in the spring  and fall. The patient has undergone allergy testing and shots in the past, with the last round of shots being in his teens. Currently, he manages his allergies with loratadine and Flonase as needed.  The patient denies any recent infections requiring antibiotics, hospitalizations for asthma, or emergency room visits. He has a history of pneumonia, with the last episode occurring over ten years ago. He denies any smoking history and reports receiving the flu shot regularly, although not this year. He also reports some heartburn symptoms but not severe enough to require medication.  He has had pets in the past but noticed some improvement in his symptoms after the pets were no longer in the home. He reports no known food allergies but has had a reaction to penicillin in the past, presenting as hives. He also has a history of eczema.  The patient's asthma symptoms have been particularly bothersome this week, with three to four days of needing to use his albuterol inhaler. He expresses a desire for better asthma control and is open to trying new medications.   Asthma main triggers are allergies.  He was evaluated by allergist in the past. Smoking exposure: denies. Up to date with flu vaccine: no. Up to date with COVID-19 vaccine: no. Prior Covid-19 infection: no. History of reflux: slight - doesn't take meds. No recent CXR.  02/11/2023 PCP visit: "Asthma-is seen by allergy and asthma at [facility].  Currently controlled on Flovent 2 puffs twice daily as needed and albuterol 2 puffs every 6 hours as needed.  He reports that his symptoms are generally well controlled and he typically needs Flovent in the morning and  maybe uses his rescue inhaler once a week. He would like to be referred to [facility] health Allergy and Asthma. He has not been able to get an appointment with [facility] and is in need of his inhalers. "  Assessment and Plan: Dustin Orr is a 47 y.o. male with: Not well controlled moderate persistent  asthma Longstanding history with recent exacerbation of symptoms including wheezing and shortness of breath. Currently on Flovent 2 puffs once daily and Albuterol as needed. Patient reports needing Albuterol 3-4 days per week. No recent hospitalizations or steroid use. Today's spirometry showed: restrictive disease with 4%, 130cc improvement in FEV1 post bronchodilator treatment. Clinically feeling improved.  Daily controller medication(s): Breo 1 puff once a day and rinse mouth after each use. Demonstrated proper use. STOP Flovent. May use Airsupra rescue inhaler 2 puffs every 4 to 6 hours as needed for shortness of breath, chest tightness, coughing, and wheezing. Do not use more than 12 puffs in 24 hours. May use Airsupra rescue inhaler 2 puffs 5 to 15 minutes prior to strenuous physical activities. Rinse mouth after each use.  Coupon given. Sample given. Monitor frequency of use - if you need to use it more than twice per week on a consistent basis let us know.   Other allergic rhinitis Symptoms of itchy, watery, runny eyes and nasal congestion. On AIT as a teen. Tried Flonase and loratadine.  Return for environmental allergy skin testing. Will make additional recommendations based on results. If significant positives will recommend allergy injections - handout given. Start Ryaltris (olopatadine + mometasone nasal spray combination) 1-2 sprays per nostril twice a day. Sample given. This replaces your other nasal sprays. If this works well for you, then have [facility] ship the medication to your home - prescription already sent in.  Nasal saline spray (i.e., Simply Saline) or nasal saline lavage (i.e., NeilMed) is recommended as needed and prior to medicated nasal sprays.  Penicillin allergy Consider penicillin allergy skin testing and in office drug challenge in the future.  Over 90% of people with history of penicillin allergy which occurred over 10 years ago are found to be  non-allergic.   Elevated blood pressure reading Please follow up with PCP regarding this.    Return in about 2 months (around 05/10/2023).  Meds ordered this encounter  Medications   fluticasone furoate-vilanterol (BREO ELLIPTA) 100-25 MCG/ACT AEPB    Sig: Inhale 1 puff into the lungs daily. Rinse mouth after each use.    Dispense:  60 each    Refill:  3   Albuterol-Budesonide (AIRSUPRA) 90-80 MCG/ACT AERO    Sig: Inhale 2 puffs into the lungs every 4 (four) hours as needed (coughing, wheezing, chest tightness). Do not exceed 12 puffs in 24 hours.    Dispense:  10.7 g    Refill:  2    BIN: [id number], PCN: [id number], GRP: [id number], ID [id number]   Olopatadine-Mometasone (RYALTRIS) 665-25 MCG/ACT SUSP    Sig: Place 1-2 sprays into the nose in the morning and at bedtime.    Dispense:  29 g    Refill:  5    [phone]   Lab Orders  No laboratory test(s) ordered today    Other allergy screening: Rhino conjunctivitis: yes Patient was on AIT x 15 yrs with some benefit. Currently taking Claritin, Flonase prn.   Food allergy: no Medication allergy: yes Penicillin -  Hymenoptera allergy: no Urticaria: yes in the past - usually triggered by environmental allergies Eczema: yes in the past.  History of recurrent infections suggestive of immunodeficency: no  Diagnostics: Spirometry:  Tracings reviewed. His effort: Good reproducible efforts. FVC: 4.12L FEV1: 3.18L, 72% predicted FEV1/FVC ratio: 74% Interpretation: Spirometry consistent with possible restrictive disease with 4%, 130cc improvement in FEV1 post bronchodilator treatment. Clinically feeling improved.   Please see scanned spirometry results for details.  Past Medical History: There are no problems to display for this patient.  Past Medical History:  Diagnosis Date   Asthma    Gout    Past Surgical History: History reviewed. No pertinent surgical history. Medication List:  Current Outpatient Medications  Medication  Sig Dispense Refill   albuterol (VENTOLIN HFA) 108 (90 Base) MCG/ACT inhaler Inhale 1-2 puffs into the lungs every 6 (six) hours as needed for wheezing or shortness of breath. 18 g 0   Albuterol-Budesonide (AIRSUPRA) 90-80 MCG/ACT AERO Inhale 2 puffs into the lungs every 4 (four) hours as needed (coughing, wheezing, chest tightness). Do not exceed 12 puffs in 24 hours. 10.7 g 2   allopurinol (ZYLOPRIM) 300 MG tablet Take 1 tablet (300 mg total) by mouth daily. 90 tablet 3   colchicine 0.6 MG tablet TAKE 1 TABLET BY MOUTH DAILY. TAKE TWO TABS ON 1ST DAY OF ACUTE FLARE AND THEN 1 TAB DAILY UNTIL FLARE HAS RESOLVED 30 tablet 1   fluticasone furoate-vilanterol (BREO ELLIPTA) 100-25 MCG/ACT AEPB Inhale 1 puff into the lungs daily. Rinse mouth after each use. 60 each 3   loratadine (CLARITIN REDITABS) 10 MG dissolvable tablet Take by mouth.     Multiple Vitamin (MULTIVITAMIN WITH MINERALS) TABS tablet Take 1 tablet by mouth daily.     Olopatadine-Mometasone (RYALTRIS) X543819 MCG/ACT SUSP Place 1-2 sprays into the nose in the morning and at bedtime. 29 g 5   Probiotic Product (PROBIOTIC ADVANCED PO) Take 1 tablet by mouth daily.     tadalafil (CIALIS) 20 MG tablet Take 0.5-1 tablets (10-20 mg total) by mouth every other day as needed for erectile dysfunction. 10 tablet 6   No current facility-administered medications for this visit.   Allergies: Allergies  Allergen Reactions   Penicillins Rash    Has patient had a PCN reaction causing immediate rash, facial/tongue/throat swelling, SOB or lightheadedness with hypotension: no Has patient had a PCN reaction causing severe rash involving mucus membranes or skin necrosis: no Has patient had a PCN reaction that required hospitalization: no Has patient had a PCN reaction occurring within the last 10 years: no If all of the above answers are "NO", then may proceed with Cephalosporin use.    Social History: Social History   Socioeconomic History    Marital status: Married    Spouse name: Not on file   Number of children: Not on file   Years of education: Not on file   Highest education level: Not on file  Occupational History   Not on file  Tobacco Use   Smoking status: Never   Smokeless tobacco: Never  Vaping Use   Vaping status: Never Used  Substance and Sexual Activity   Alcohol use: Yes    Alcohol/week: 12.0 standard drinks of alcohol    Types: 6 Glasses of wine, 6 Cans of beer per week    Comment: Weekends   Drug use: No   Sexual activity: Yes  Other Topics Concern   Not on file  Social History Narrative   Art gallery manager    Social Determinants of Health   Financial Resource Strain: Patient Declined (02/10/2023)   Overall Financial Resource Strain (CARDIA)  Difficulty of Paying Living Expenses: Patient declined  Food Insecurity: Patient Declined (02/10/2023)   Hunger Vital Sign    Worried About Running Out of Food in the Last Year: Patient declined    Ran Out of Food in the Last Year: Patient declined  Transportation Needs: Patient Declined (02/10/2023)   PRAPARE - Administrator, Civil Service (Medical): Patient declined    Lack of Transportation (Non-Medical): Patient declined  Physical Activity: Sufficiently Active (02/10/2023)   Exercise Vital Sign    Days of Exercise per Week: 6 days    Minutes of Exercise per Session: 50 min  Stress: Stress Concern Present (02/10/2023)   [facility]ustin Orr [employer] - Occupational Stress Questionnaire    Feeling of Stress : To some extent  Social Connections: Unknown (02/10/2023)   Social Connection and Isolation Panel [NHANES]    Frequency of Communication with Friends and Family: More than three times a week    Frequency of Social Gatherings with Friends and Family: Patient declined    Attends Religious Services: Patient declined    Database administrator or Organizations: Yes    Attends Engineer, structural: More than 4 times per year     Marital Status: Married   Lives in a house. Smoking: denies Occupation: Oncologist HistorySurveyor, minerals in the house: no Engineer, civil (consulting) in the family room: no Carpet in the bedroom: no Heating: electric and gas Cooling: central Pet: no  Family History: Family History  Problem Relation Age of Onset   Hearing loss Mother    Hearing loss Father    Diabetes Maternal Grandfather    Scoliosis Paternal Grandmother    Problem                               Relation Asthma                                   Paternal side Eczema                                no Food allergy                          no Allergic rhino conjunctivitis     paternal side   Review of Systems  Constitutional:  Negative for appetite change, chills, fever and unexpected weight change.  HENT:  Positive for congestion. Negative for rhinorrhea.   Eyes:  Negative for itching.  Respiratory:  Positive for chest tightness, shortness of breath and wheezing. Negative for cough.   Cardiovascular:  Negative for chest pain.  Gastrointestinal:  Negative for abdominal pain.  Genitourinary:  Negative for difficulty urinating.  Skin:  Negative for rash.  Neurological:  Negative for headaches.    Objective: BP (!) 142/98   Pulse 76   Temp 98 F (36.7 C)   Resp 18   Ht 6' 1.5" (1.867 m)   Wt 274 lb 4 oz (124.4 kg)   SpO2 95%   BMI 35.69 kg/m  Body mass index is 35.69 kg/m. Physical Exam Vitals and nursing note reviewed.  Constitutional:      Appearance: Normal appearance. He is well-developed.  HENT:     Head: Normocephalic and atraumatic.     Right  Ear: Tympanic membrane and external ear normal.     Left Ear: Tympanic membrane and external ear normal.     Nose: Congestion present.     Mouth/Throat:     Mouth: Mucous membranes are moist.     Pharynx: Oropharynx is clear.  Eyes:     Conjunctiva/sclera: Conjunctivae normal.  Cardiovascular:     Rate and Rhythm: Normal rate and regular rhythm.      Heart sounds: Normal heart sounds. No murmur heard.    No friction rub. No gallop.  Pulmonary:     Effort: Pulmonary effort is normal.     Breath sounds: Normal breath sounds. No wheezing, rhonchi or rales.  Musculoskeletal:     Cervical back: Neck supple.  Skin:    General: Skin is warm.     Findings: No rash.  Neurological:     Mental Status: He is alert and oriented to person, place, and time.  Psychiatric:        Behavior: Behavior normal.   The plan was reviewed with the patient/family, and all questions/concerned were addressed.  It was my pleasure to see Sota today and participate in his care. Please feel free to contact me with any questions or concerns.  Sincerely,  Wyline Mood, DO Allergy & Immunology  [facility] office: [phone] [facility] office: [phone]

## 2023-03-10 NOTE — Patient Instructions (Addendum)
Asthma  Daily controller medication(s): Breo 1 puff once a day and rinse mouth after each use. Demonstrated proper use. STOP Flovent. Check the pricing for the following inhalers if Virgel Bouquet is not covered.  Elwin Sleight Advair HFA Advair Diskus Wixela Symbicort May use Airsupra rescue inhaler 2 puffs every 4 to 6 hours as needed for shortness of breath, chest tightness, coughing, and wheezing. Do not use more than 12 puffs in 24 hours. May use Airsupra rescue inhaler 2 puffs 5 to 15 minutes prior to strenuous physical activities. Rinse mouth after each use.  Coupon given. Sample given. Monitor frequency of use - if you need to use it more than twice per week on a consistent basis let us know.  Breathing control goals:  Full participation in all desired activities (may need albuterol before activity) Albuterol use two times or less a week on average (not counting use with activity) Cough interfering with sleep two times or less a month Oral steroids no more than once a year No hospitalizations   Rhinitis  Return for environmental allergy skin testing. Will make additional recommendations based on results. If significant positives will recommend allergy injections - handout given. Make sure you don't take any antihistamines for 3 days before the skin testing appointment. Don't put any lotion on the back and arms on the day of testing.  Plan on being here for 30-60 minutes.   Start Ryaltris (olopatadine + mometasone nasal spray combination) 1-2 sprays per nostril twice a day. Sample given. This replaces your other nasal sprays. If this works well for you, then have [facility] ship the medication to your home - prescription already sent in.  Nasal saline spray (i.e., Simply Saline) or nasal saline lavage (i.e., NeilMed) is recommended as needed and prior to medicated nasal sprays.  Penicillin allergy Consider penicillin allergy skin testing and in office drug challenge in the future.  Over  90% of people with history of penicillin allergy which occurred over 10 years ago are found to be non-allergic.   Elevated blood pressure  Blood pressure reading was high in our office today. Vitals:   03/10/23 0946  BP: (!) 134/94   Please follow up with PCP regarding this.    Follow up in 2 months or sooner if needed.

## 2023-03-11 ENCOUNTER — Telehealth: Payer: Self-pay

## 2023-03-11 MED ORDER — AZELASTINE-FLUTICASONE 137-50 MCG/ACT NA SUSP: 1.0000 | Freq: Two times a day (BID) | NASAL | 5 refills | Status: AC

## 2023-03-11 NOTE — Telephone Encounter (Signed)
*  Asthma/Allergy  Pharmacy Patient Advocate Encounter   Received notification from CoverMyMeds that prior authorization for Ryaltris 665-25MCG/ACT suspension  is required/requested.   Insurance verification completed.   The patient is insured through [employer] .   Per test claim: PA required; PA submitted to above mentioned insurance via CoverMyMeds Key/confirmation #/EOC [id number] Status is pending

## 2023-03-11 NOTE — Telephone Encounter (Signed)
Pa for ryaltris denied Preferred dymista generic, flunisolider, fluticasone, mometasone

## 2023-03-11 NOTE — Telephone Encounter (Signed)
Generic dymista rx sent to local pharmacy.

## 2023-03-11 NOTE — Addendum Note (Signed)
Addended by: Ellamae Sia on: 03/11/2023 03:59 PM   Modules accepted: Orders

## 2023-04-06 ENCOUNTER — Encounter: Payer: Self-pay | Admitting: Adult Health

## 2023-04-12 ENCOUNTER — Other Ambulatory Visit: Payer: Self-pay | Admitting: Adult Health

## 2023-04-12 MED ORDER — LISINOPRIL 10 MG PO TABS: 10.0000 mg | ORAL_TABLET | Freq: Every day | ORAL | 0 refills | Status: DC

## 2023-04-21 ENCOUNTER — Ambulatory Visit: Payer: BC Managed Care – PPO | Admitting: Adult Health

## 2023-04-21 ENCOUNTER — Encounter: Payer: Self-pay | Admitting: Adult Health

## 2023-04-21 VITALS — BP 138/100 | HR 106 | Temp 98.3°F | Ht 73.5 in | Wt 273.0 lb

## 2023-04-21 DIAGNOSIS — J0111 Acute recurrent frontal sinusitis: Secondary | ICD-10-CM

## 2023-04-21 MED ORDER — DOXYCYCLINE HYCLATE 100 MG PO CAPS: 100.0000 mg | ORAL_CAPSULE | Freq: Two times a day (BID) | ORAL | 0 refills | Status: DC

## 2023-04-21 NOTE — Progress Notes (Signed)
Subjective:    Patient ID: Dustin Orr, male    DOB: [birth year], 47 y.o.   MRN: [id number]  Headache  Associated symptoms include a fever.  Fever  Associated symptoms include headaches.   47 year old male who  has a past medical history of Asthma and Gout.  He is being evaluated today for an acute issue. He reports that over the last week , he has been experiencing headache, fatigue, fever, chills, sinus pressure on the left side,  and sore throat.   He has had a negative Covid test as well as Flu A and B.  He reports that he has been taking Doxycycline 50 mg daily x 3 days ( prescribed by Dermatology) and he reports that this has helped him feel better.     Review of Systems  Constitutional:  Positive for fever.  Neurological:  Positive for headaches.   See HPI   Past Medical History:  Diagnosis Date   Asthma    Gout     Social History   Socioeconomic History   Marital status: Married    Spouse name: Not on file   Number of children: Not on file   Years of education: Not on file   Highest education level: Not on file  Occupational History   Not on file  Tobacco Use   Smoking status: Never   Smokeless tobacco: Never  Vaping Use   Vaping status: Never Used  Substance and Sexual Activity   Alcohol use: Yes    Alcohol/week: 12.0 standard drinks of alcohol    Types: 6 Glasses of wine, 6 Cans of beer per week    Comment: Weekends   Drug use: No   Sexual activity: Yes  Other Topics Concern   Not on file  Social History Narrative   Art gallery manager    Social Drivers of Health   Financial Resource Strain: Patient Declined (02/10/2023)   Overall Financial Resource Strain (CARDIA)    Difficulty of Paying Living Expenses: Patient declined  Food Insecurity: Patient Declined (02/10/2023)   Hunger Vital Sign    Worried About Running Out of Food in the Last Year: Patient declined    Ran Out of Food in the Last Year: Patient declined  Transportation Needs: Patient  Declined (02/10/2023)   PRAPARE - Administrator, Civil Service (Medical): Patient declined    Lack of Transportation (Non-Medical): Patient declined  Physical Activity: Sufficiently Active (02/10/2023)   Exercise Vital Sign    Days of Exercise per Week: 6 days    Minutes of Exercise per Session: 50 min  Stress: Stress Concern Present (02/10/2023)   [employer] of Occupational Health - Occupational Stress Questionnaire    Feeling of Stress : To some extent  Social Connections: Unknown (02/10/2023)   Social Connection and Isolation Panel [NHANES]    Frequency of Communication with Friends and Family: More than three times a week    Frequency of Social Gatherings with Friends and Family: Patient declined    Attends Religious Services: Patient declined    Database administrator or Organizations: Yes    Attends Engineer, structural: More than 4 times per year    Marital Status: Married  Catering manager Violence: Not on file    History reviewed. No pertinent surgical history.  Family History  Problem Relation Age of Onset   Hearing loss Mother    Hearing loss Father    Diabetes Maternal Grandfather    Scoliosis Paternal  Grandmother     Allergies  Allergen Reactions   Penicillins Rash    Has patient had a PCN reaction causing immediate rash, facial/tongue/throat swelling, SOB or lightheadedness with hypotension: no Has patient had a PCN reaction causing severe rash involving mucus membranes or skin necrosis: no Has patient had a PCN reaction that required hospitalization: no Has patient had a PCN reaction occurring within the last 10 years: no If all of the above answers are "NO", then may proceed with Cephalosporin use.     Current Outpatient Medications on File Prior to Visit  Medication Sig Dispense Refill   albuterol (VENTOLIN HFA) 108 (90 Base) MCG/ACT inhaler Inhale 1-2 puffs into the lungs every 6 (six) hours as needed for wheezing or shortness  of breath. 18 g 0   Albuterol-Budesonide (AIRSUPRA) 90-80 MCG/ACT AERO Inhale 2 puffs into the lungs every 4 (four) hours as needed (coughing, wheezing, chest tightness). Do not exceed 12 puffs in 24 hours. 10.7 g 2   allopurinol (ZYLOPRIM) 300 MG tablet Take 1 tablet (300 mg total) by mouth daily. 90 tablet 3   Azelastine-Fluticasone 137-50 MCG/ACT SUSP Place 1 spray into the nose in the morning and at bedtime. 23 g 5   colchicine 0.6 MG tablet TAKE 1 TABLET BY MOUTH DAILY. TAKE TWO TABS ON 1ST DAY OF ACUTE FLARE AND THEN 1 TAB DAILY UNTIL FLARE HAS RESOLVED 30 tablet 1   fluticasone furoate-vilanterol (BREO ELLIPTA) 100-25 MCG/ACT AEPB Inhale 1 puff into the lungs daily. Rinse mouth after each use. 60 each 3   indomethacin (INDOCIN) 50 MG capsule Take by mouth.     lisinopril (ZESTRIL) 10 MG tablet Take 1 tablet (10 mg total) by mouth daily. 90 tablet 0   loratadine (CLARITIN REDITABS) 10 MG dissolvable tablet Take by mouth.     Multiple Vitamin (MULTIVITAMIN WITH MINERALS) TABS tablet Take 1 tablet by mouth daily.     Probiotic Product (PROBIOTIC ADVANCED PO) Take 1 tablet by mouth daily.     tadalafil (CIALIS) 20 MG tablet Take 0.5-1 tablets (10-20 mg total) by mouth every other day as needed for erectile dysfunction. 10 tablet 6   No current facility-administered medications on file prior to visit.    BP (!) 138/100   Pulse (!) 106   Temp 98.3 F (36.8 C) (Oral)   Ht 6' 1.5" (1.867 m)   Wt 273 lb (123.8 kg)   SpO2 96%   BMI 35.53 kg/m       Objective:   Physical Exam Vitals and nursing note reviewed.  Constitutional:      Appearance: Normal appearance.  HENT:     Nose: Congestion and rhinorrhea present. Rhinorrhea is purulent.     Right Turbinates: Not enlarged or swollen.     Left Turbinates: Enlarged and swollen.     Right Sinus: No maxillary sinus tenderness or frontal sinus tenderness.     Left Sinus: Frontal sinus tenderness present. No maxillary sinus tenderness.   Cardiovascular:     Rate and Rhythm: Normal rate and regular rhythm.     Pulses: Normal pulses.     Heart sounds: Normal heart sounds.  Pulmonary:     Effort: Pulmonary effort is normal.     Breath sounds: Normal breath sounds.  Musculoskeletal:        General: Normal range of motion.  Skin:    General: Skin is warm and dry.  Neurological:     General: No focal deficit present.  Mental Status: He is alert and oriented to person, place, and time.  Psychiatric:        Mood and Affect: Mood normal.        Behavior: Behavior normal.        Thought Content: Thought content normal.        Judgment: Judgment normal.        Assessment & Plan:  1. Acute recurrent frontal sinusitis (Primary) - Symptoms consistent with sinsuitis. Since he started taking Doxycyline, I will have him stop the low dose and prescribe him 100 mg BID x 7 days - Follow up if needed - doxycycline (VIBRAMYCIN) 100 MG capsule; Take 1 capsule (100 mg total) by mouth 2 (two) times daily.  Dispense: 14 capsule; Refill: 0  Shirline Frees, NP

## 2023-05-16 NOTE — Progress Notes (Signed)
 Follow Up Note  RE: Dustin Orr MRN: [id number] DOB: August 06, 1975 Date of Office Visit: 05/17/2023  Referring provider: Merna Huxley, NP Primary care provider: Merna Huxley, NP  Chief Complaint: Follow-up  History of Present Illness: I had the pleasure of seeing Dustin Orr for a follow up visit at the [facility] on 05/17/2023. He is a 48 y.o. male, who is being followed for asthma, allergic rhinitis, penicillin allergy. His previous allergy office visit was on 03/10/2023 with Dr. Luke. Today is a regular follow up visit.  Discussed the use of AI scribe software for clinical note transcription with the patient, who gave verbal consent to proceed.  The patient, with a history of asthma, presents with concerns about his current inhaler regimen. He reports that he has been using the Breo inhaler once daily and the Airsupra  inhaler as a rescue inhaler. However, he expresses dissatisfaction with the Breo inhaler, comparing its effects to those of Advair, which he previously used. He describes a dependency-like effect, where missing a dose of Breo leads to a significant increase in the need for the rescue inhaler, approximately every two hours. This is in contrast to his experience with Flovent , which he reports was effective in preventing asthma attacks and noted less issues and less use of rescue inhaler if he missed a dose of Flovent .   The patient also mentions a recent sinus infection, which he attributes to significant climate changes due to travel. He reports using Flonase  as needed, particularly before mowing the lawn, along with an allergy pill. He also mentions a recent trial of a low-dose blood pressure medication, which he discontinued after three days due to significant side effects.   The patient expresses a desire to find an inhaler regimen that is effective without the perceived dependency effect of the Breo inhaler. He is open to trying different  medications, including Symbicort .     Assessment and Plan: Dustin Orr is a 48 y.o. male with: Moderate persistent asthma Past history - Longstanding history with recent exacerbation of symptoms including wheezing and shortness of breath. Currently on Flovent  110mcg 2 puffs once daily and Albuterol  as needed. Patient reports needing Albuterol  3-4 days per week. No recent hospitalizations or steroid use. 2024 spirometry showed: restrictive disease with 4%, 130cc improvement in FEV1 post bronchodilator treatment. Clinically feeling improved.  Interim history - noted needing to use Airsupra  every 2 hours if misses a dose of Breo similar to the Advair issue. Less dependency when on Flovent . Today's spirometry showed some restriction - similar to previous one.  Discussed the various inhalers available.  Daily controller medication(s): Start Symbicort  160mcg 2 puffs twice a day with spacer and rinse mouth afterwards. Stop Breo. Let me know if it's not covered or doesn't work as well. May use Airsupra  rescue inhaler 2 puffs every 4 to 6 hours as needed for shortness of breath, chest tightness, coughing, and wheezing. Do not use more than 12 puffs in 24 hours. May use Airsupra  rescue inhaler 2 puffs 5 to 15 minutes prior to strenuous physical activities. Rinse mouth after each use.  Monitor frequency of use - if you need to use it more than twice per week on a consistent basis let us  know.    Other allergic rhinitis Past history - symptoms of itchy, watery, runny eyes and nasal congestion. On AIT as a teen. Tried Flonase  and loratadine.  Interim history - Patient uses Flonase  as needed, particularly before exposure to allergens such as  mowing the lawn. Ryaltris  not more effective than Flonase .  Return for environmental allergy skin testing. Use Flonase  (fluticasone ) nasal spray 1-2 sprays per nostril once a day as needed for nasal congestion.  Nasal saline spray (i.e., Simply Saline) or nasal saline lavage  (i.e., NeilMed) is recommended as needed and prior to medicated nasal sprays. If symptoms worsening, then recommend allergy testing next.    Penicillin allergy Consider penicillin allergy skin testing and in office drug challenge in the future.  Over 90% of people with history of penicillin allergy which occurred over 10 years ago are found to be non-allergic.    Return in about 3 months (around 08/15/2023).  Meds ordered this encounter  Medications   budesonide -formoterol  (SYMBICORT ) 160-4.5 MCG/ACT inhaler    Sig: Inhale 2 puffs into the lungs in the morning and at bedtime. with spacer and rinse mouth afterwards.    Dispense:  1 each    Refill:  3   Albuterol -Budesonide  (AIRSUPRA ) 90-80 MCG/ACT AERO    Sig: Inhale 2 puffs into the lungs every 4 (four) hours as needed (coughing, wheezing, chest tightness). Do not exceed 12 puffs in 24 hours.    Dispense:  10.7 g    Refill:  2    BIN: [id number], PCN: [id number], GRP: [id number], ID [id number]   Lab Orders  No laboratory test(s) ordered today    Diagnostics: Spirometry:  Tracings reviewed. His effort: Good reproducible efforts. FVC: 3.81L FEV1: 3.00L, 68% predicted FEV1/FVC ratio: 79% Interpretation: Spirometry consistent with possible restrictive disease.  Please see scanned spirometry results for details.  Results discussed with patient/family.   Medication List:  Current Outpatient Medications  Medication Sig Dispense Refill   albuterol  (VENTOLIN  HFA) 108 (90 Base) MCG/ACT inhaler Inhale 1-2 puffs into the lungs every 6 (six) hours as needed for wheezing or shortness of breath. 18 g 0   allopurinol  (ZYLOPRIM ) 300 MG tablet Take 1 tablet (300 mg total) by mouth daily. 90 tablet 3   Azelastine -Fluticasone  137-50 MCG/ACT SUSP Place 1 spray into the nose in the morning and at bedtime. 23 g 5   budesonide -formoterol  (SYMBICORT ) 160-4.5 MCG/ACT inhaler Inhale 2 puffs into the lungs in the morning and at bedtime. with spacer and rinse mouth  afterwards. 1 each 3   colchicine  0.6 MG tablet TAKE 1 TABLET BY MOUTH DAILY. TAKE TWO TABS ON 1ST DAY OF ACUTE FLARE AND THEN 1 TAB DAILY UNTIL FLARE HAS RESOLVED 30 tablet 1   indomethacin (INDOCIN) 50 MG capsule Take by mouth.     lisinopril  (ZESTRIL ) 10 MG tablet Take 1 tablet (10 mg total) by mouth daily. 90 tablet 0   loratadine (CLARITIN REDITABS) 10 MG dissolvable tablet Take by mouth.     Multiple Vitamin (MULTIVITAMIN WITH MINERALS) TABS tablet Take 1 tablet by mouth daily.     Probiotic Product (PROBIOTIC ADVANCED PO) Take 1 tablet by mouth daily.     tadalafil  (CIALIS ) 20 MG tablet Take 0.5-1 tablets (10-20 mg total) by mouth every other day as needed for erectile dysfunction. 10 tablet 6   Albuterol -Budesonide  (AIRSUPRA ) 90-80 MCG/ACT AERO Inhale 2 puffs into the lungs every 4 (four) hours as needed (coughing, wheezing, chest tightness). Do not exceed 12 puffs in 24 hours. 10.7 g 2   No current facility-administered medications for this visit.   Allergies: Allergies  Allergen Reactions   Penicillins Rash    Has patient had a PCN reaction causing immediate rash, facial/tongue/throat swelling, SOB or lightheadedness with hypotension: no Has patient had  a PCN reaction causing severe rash involving mucus membranes or skin necrosis: no Has patient had a PCN reaction that required hospitalization: no Has patient had a PCN reaction occurring within the last 10 years: no If all of the above answers are NO, then may proceed with Cephalosporin use.    I reviewed his past medical history, social history, family history, and environmental history and no significant changes have been reported from his previous visit.  Review of Systems  Constitutional:  Negative for appetite change, chills, fever and unexpected weight change.  HENT:  Positive for congestion. Negative for rhinorrhea.   Eyes:  Negative for itching.  Respiratory:  Negative for cough, chest tightness, shortness of breath  and wheezing.   Cardiovascular:  Negative for chest pain.  Gastrointestinal:  Negative for abdominal pain.  Genitourinary:  Negative for difficulty urinating.  Skin:  Negative for rash.  Neurological:  Negative for headaches.    Objective: BP 124/88   Pulse 88   Temp 98.8 F (37.1 C)   Resp 16   SpO2 94%  There is no height or weight on file to calculate BMI. Physical Exam Vitals and nursing note reviewed.  Constitutional:      Appearance: Normal appearance. He is well-developed.  HENT:     Head: Normocephalic and atraumatic.     Right Ear: Tympanic membrane and external ear normal.     Left Ear: Tympanic membrane and external ear normal.     Nose: Congestion present.     Mouth/Throat:     Mouth: Mucous membranes are moist.     Pharynx: Oropharynx is clear.  Eyes:     Conjunctiva/sclera: Conjunctivae normal.  Cardiovascular:     Rate and Rhythm: Normal rate and regular rhythm.     Heart sounds: Normal heart sounds. No murmur heard.    No friction rub. No gallop.  Pulmonary:     Effort: Pulmonary effort is normal.     Breath sounds: Normal breath sounds. No wheezing, rhonchi or rales.  Musculoskeletal:     Cervical back: Neck supple.  Skin:    General: Skin is warm.     Findings: No rash.  Neurological:     Mental Status: He is alert and oriented to person, place, and time.  Psychiatric:        Behavior: Behavior normal.    Previous notes and tests were reviewed. The plan was reviewed with the patient/family, and all questions/concerned were addressed.  It was my pleasure to see Dustin Orr today and participate in his care. Please feel free to contact me with any questions or concerns.  Sincerely,  Orlan Cramp, DO Allergy & Immunology  [facility]  [facility] office: [phone] [facility] office: [phone]

## 2023-05-17 ENCOUNTER — Ambulatory Visit (INDEPENDENT_AMBULATORY_CARE_PROVIDER_SITE_OTHER): Payer: 59 | Admitting: Allergy

## 2023-05-17 ENCOUNTER — Encounter: Payer: Self-pay | Admitting: Allergy

## 2023-05-17 VITALS — BP 124/88 | HR 88 | Temp 98.8°F | Resp 16

## 2023-05-17 DIAGNOSIS — J3089 Other allergic rhinitis: Secondary | ICD-10-CM

## 2023-05-17 DIAGNOSIS — J4540 Moderate persistent asthma, uncomplicated: Secondary | ICD-10-CM

## 2023-05-17 DIAGNOSIS — Z880 Allergy status to penicillin: Secondary | ICD-10-CM

## 2023-05-17 MED ORDER — BUDESONIDE-FORMOTEROL FUMARATE 160-4.5 MCG/ACT IN AERO: 2.0000 | INHALATION_SPRAY | Freq: Two times a day (BID) | RESPIRATORY_TRACT | 3 refills | Status: DC

## 2023-05-17 MED ORDER — AIRSUPRA 90-80 MCG/ACT IN AERO: 2.0000 | INHALATION_SPRAY | RESPIRATORY_TRACT | 2 refills | Status: DC | PRN

## 2023-05-17 NOTE — Patient Instructions (Addendum)
 Asthma  Daily controller medication(s): Start Symbicort  160mcg 2 puffs twice a day with spacer and rinse mouth afterwards. Stop Breo. Let me know if it's not covered or doesn't work as well.  May use Airsupra  rescue inhaler 2 puffs every 4 to 6 hours as needed for shortness of breath, chest tightness, coughing, and wheezing. Do not use more than 12 puffs in 24 hours. May use Airsupra  rescue inhaler 2 puffs 5 to 15 minutes prior to strenuous physical activities. Rinse mouth after each use.  Monitor frequency of use - if you need to use it more than twice per week on a consistent basis let us  know.  Breathing control goals:  Full participation in all desired activities (may need albuterol  before activity) Albuterol  use two times or less a week on average (not counting use with activity) Cough interfering with sleep two times or less a month Oral steroids no more than once a year No hospitalizations   Rhinitis  Use Flonase  (fluticasone ) nasal spray 1-2 sprays per nostril once a day as needed for nasal congestion.  Nasal saline spray (i.e., Simply Saline) or nasal saline lavage (i.e., NeilMed) is recommended as needed and prior to medicated nasal sprays. If symptoms worsening, then recommend allergy testing next.   Follow up in 3 months or sooner if needed.

## 2023-05-19 ENCOUNTER — Encounter: Payer: Self-pay | Admitting: Allergy

## 2023-05-19 MED ORDER — ALBUTEROL SULFATE HFA 108 (90 BASE) MCG/ACT IN AERS: 2.0000 | INHALATION_SPRAY | RESPIRATORY_TRACT | 1 refills | Status: DC | PRN

## 2023-05-19 MED ORDER — FLUTICASONE-SALMETEROL 500-50 MCG/ACT IN AEPB: 1.0000 | INHALATION_SPRAY | Freq: Two times a day (BID) | RESPIRATORY_TRACT | 3 refills | Status: DC

## 2023-09-05 ENCOUNTER — Other Ambulatory Visit: Payer: Self-pay | Admitting: Allergy

## 2023-10-11 ENCOUNTER — Encounter: Payer: Self-pay | Admitting: Adult Health

## 2023-12-20 ENCOUNTER — Encounter: Payer: Self-pay | Admitting: Allergy

## 2024-01-10 ENCOUNTER — Ambulatory Visit (INDEPENDENT_AMBULATORY_CARE_PROVIDER_SITE_OTHER): Admitting: Allergy

## 2024-01-10 ENCOUNTER — Other Ambulatory Visit: Payer: Self-pay

## 2024-01-10 ENCOUNTER — Encounter: Payer: Self-pay | Admitting: Allergy

## 2024-01-10 VITALS — BP 138/88 | HR 72 | Temp 98.4°F | Resp 18 | Ht 73.0 in | Wt 284.8 lb

## 2024-01-10 DIAGNOSIS — Z880 Allergy status to penicillin: Secondary | ICD-10-CM

## 2024-01-10 DIAGNOSIS — J3089 Other allergic rhinitis: Secondary | ICD-10-CM

## 2024-01-10 DIAGNOSIS — J4540 Moderate persistent asthma, uncomplicated: Secondary | ICD-10-CM

## 2024-01-10 DIAGNOSIS — R030 Elevated blood-pressure reading, without diagnosis of hypertension: Secondary | ICD-10-CM | POA: Diagnosis not present

## 2024-01-10 MED ORDER — ALBUTEROL SULFATE HFA 108 (90 BASE) MCG/ACT IN AERS: 2.0000 | INHALATION_SPRAY | RESPIRATORY_TRACT | 3 refills | Status: AC | PRN

## 2024-01-10 NOTE — Patient Instructions (Addendum)
 Asthma  Normal breathing test today.  Daily controller medication(s): continue Wixela 500mcg 1 puff once a day and rinse mouth after each use. When you run out and doing well then will decrease to dose to 250mcg 1 puff once a day.  May use albuterol  rescue inhaler 2 puffs every 4 to 6 hours as needed for shortness of breath, chest tightness, coughing, and wheezing. May use albuterol  rescue inhaler 2 puffs 5 to 15 minutes prior to strenuous physical activities. Monitor frequency of use - if you need to use it more than twice per week on a consistent basis let us  know.  Breathing control goals:  Full participation in all desired activities (may need albuterol  before activity) Albuterol  use two times or less a week on average (not counting use with activity) Cough interfering with sleep two times or less a month Oral steroids no more than once a year No hospitalizations   Rhinitis  Use Flonase  (fluticasone ) nasal spray 1-2 sprays per nostril once a day as needed for nasal congestion.  Nasal saline spray (i.e., Simply Saline) or nasal saline lavage (i.e., NeilMed) is recommended as needed and prior to medicated nasal sprays. If symptoms worsening, then recommend allergy testing next.   Penicillin allergy Consider penicillin allergy skin testing and in office drug challenge in the future.  Over 90% of people with history of penicillin allergy which occurred over 10 years ago are found to be non-allergic.  You must be off antihistamines for 3-5 days before. Must be in good health and not ill. No vaccines/injections/antibiotics within the past 7 days. Plan on being in the office for 2-3 hours and must bring in the drug you want to do the oral challenge for - will send in prescription to pick up a few days before. You must call to schedule an appointment and specify it's for a drug challenge.   Elevated blood pressure  Blood pressure reading was high in our office today. Vitals:   01/10/24 1126  01/10/24 1151  BP: (!) 140/100 138/88   Please follow up with PCP regarding this.    Follow up in 3 months or sooner if needed.

## 2024-01-10 NOTE — Progress Notes (Signed)
 Follow Up Note  RE: Dustin Orr MRN: [id number] DOB: 18-Feb-1976 Date of Office Visit: 01/10/2024  Referring provider: Merna Huxley, NP Primary care provider: Merna Huxley, NP  Chief Complaint: Follow-up (States just here for a follow up/routine visit.)  History of Present Illness: I had the pleasure of seeing Dustin Orr for a follow up visit at the [facility] on 01/10/2024. He is a 48 y.o. male, who is being followed for asthma, allergic rhinitis, penicillin allergy. His previous allergy office visit was on 05/17/2023 with Dr. Luke. Today is a regular follow up visit.  Discussed the use of AI scribe software for clinical note transcription with the patient, who gave verbal consent to proceed.    His asthma is well-controlled with Wixela 500mcg, taken as one puff in the morning. He has been using this medication since January, which allows him to run a mile without issues. However, during a recent trip when he ran out of [place], he experienced increased asthma symptoms requiring the use of his rescue inhaler every four hours. He finds the cost of Wixela manageable and has a supply that will last until the end of the year. He compares his current experience with Wixela to his previous use of Advair, noting similar patterns of symptom control and exacerbation when the medication is missed.  He uses albuterol  as a rescue inhaler and has a prescription for Airsupra , which he found to be expensive. He also uses an over-the-counter nasal spray for allergies, particularly before mowing the lawn or spending time outside.   He has a history of penicillin allergy, which caused a rash when he was a teenager.   No recent fevers, chills, or changes in appetite or bowel habits. His blood pressure has been elevated for about two years, and he is currently taking lisinopril . He notes that his blood pressure was like a 'light switch' change, without any lifestyle changes such  as smoking. He reports consuming two and a half cups of coffee in the morning.     Assessment and Plan: Dustin Orr is a 48 y.o. male with: Moderate persistent asthma Past history - Longstanding history with recent exacerbation of symptoms including wheezing and shortness of breath. Currently on Flovent  110mcg 2 puffs once daily and Albuterol  as needed. Patient reports needing Albuterol  3-4 days per week. No recent hospitalizations or steroid use. 2024 spirometry showed: restrictive disease with 4%, 130cc improvement in FEV1 post bronchodilator treatment. Clinically feeling improved.  Interim history - Airsupra  too expensive. Now on Wixela 500mcg with good benefit but symptomatic if misses a dose.  Today's spirometry was normal.  Daily controller medication(s): continue Wixela 500mcg 1 puff once a day and rinse mouth after each use. Consider stepping down therapy to Wixela 250mcg dose at next visit.  May use albuterol  rescue inhaler 2 puffs every 4 to 6 hours as needed for shortness of breath, chest tightness, coughing, and wheezing. May use albuterol  rescue inhaler 2 puffs 5 to 15 minutes prior to strenuous physical activities. Monitor frequency of use - if you need to use it more than twice per week on a consistent basis let us  know.    Other allergic rhinitis Past history - symptoms of itchy, watery, runny eyes and nasal congestion. On AIT as a teen. Tried Flonase  and loratadine.  Interim history - stable with prn meds.  Use Flonase  (fluticasone ) nasal spray 1-2 sprays per nostril once a day as needed for nasal congestion.  Nasal saline spray (i.e.,  Simply Saline) or nasal saline lavage (i.e., NeilMed) is recommended as needed and prior to medicated nasal sprays. If symptoms worsening, then recommend allergy testing next.    Penicillin allergy Rash as a teen.  Consider penicillin allergy skin testing and in office drug challenge in the future.  Over 90% of people with history of penicillin  allergy which occurred over 10 years ago are found to be non-allergic.   Elevated blood pressure reading Please follow up with PCP regarding this.    Return in about 3 months (around 04/10/2024).  Meds ordered this encounter  Medications   albuterol  (VENTOLIN  HFA) 108 (90 Base) MCG/ACT inhaler    Sig: Inhale 2 puffs into the lungs every 4 (four) hours as needed for wheezing or shortness of breath (coughing fits).    Dispense:  18 g    Refill:  3   Lab Orders  No laboratory test(s) ordered today    Diagnostics: Spirometry:  Tracings reviewed. His effort: Good reproducible efforts. FVC: 4.34L FEV1: 3.34L, 81% predicted FEV1/FVC ratio: 77% Interpretation: Spirometry consistent with normal pattern.  Please see scanned spirometry results for details.  Results discussed with patient/family.   Medication List:  Current Outpatient Medications  Medication Sig Dispense Refill   allopurinol  (ZYLOPRIM ) 300 MG tablet Take 1 tablet (300 mg total) by mouth daily. 90 tablet 3   Azelastine -Fluticasone  137-50 MCG/ACT SUSP Place 1 spray into the nose in the morning and at bedtime. 23 g 5   colchicine  0.6 MG tablet TAKE 1 TABLET BY MOUTH DAILY. TAKE TWO TABS ON 1ST DAY OF ACUTE FLARE AND THEN 1 TAB DAILY UNTIL FLARE HAS RESOLVED 30 tablet 1   indomethacin (INDOCIN) 50 MG capsule Take by mouth.     lisinopril  (ZESTRIL ) 10 MG tablet Take 1 tablet (10 mg total) by mouth daily. 90 tablet 0   loratadine (CLARITIN REDITABS) 10 MG dissolvable tablet Take by mouth.     Multiple Vitamin (MULTIVITAMIN WITH MINERALS) TABS tablet Take 1 tablet by mouth daily.     Probiotic Product (PROBIOTIC ADVANCED PO) Take 1 tablet by mouth daily.     tadalafil  (CIALIS ) 20 MG tablet Take 0.5-1 tablets (10-20 mg total) by mouth every other day as needed for erectile dysfunction. 10 tablet 6   WIXELA INHUB 500-50 MCG/ACT AEPB INHALE 1 PUFF INTO THE LUNGS IN THE MORNING AND AT BEDTIME. RINSE MOUTH AFTER EACH USE. 180 each 2    albuterol  (VENTOLIN  HFA) 108 (90 Base) MCG/ACT inhaler Inhale 2 puffs into the lungs every 4 (four) hours as needed for wheezing or shortness of breath (coughing fits). 18 g 3   No current facility-administered medications for this visit.   Allergies: Allergies  Allergen Reactions   Penicillins Rash    Has patient had a PCN reaction causing immediate rash, facial/tongue/throat swelling, SOB or lightheadedness with hypotension: no Has patient had a PCN reaction causing severe rash involving mucus membranes or skin necrosis: no Has patient had a PCN reaction that required hospitalization: no Has patient had a PCN reaction occurring within the last 10 years: no If all of the above answers are NO, then may proceed with Cephalosporin use.    I reviewed his past medical history, social history, family history, and environmental history and no significant changes have been reported from his previous visit.  Review of Systems  Constitutional:  Negative for appetite change, chills, fever and unexpected weight change.  HENT:  Negative for congestion and rhinorrhea.   Eyes:  Negative for  itching.  Respiratory:  Negative for cough, chest tightness, shortness of breath and wheezing.   Cardiovascular:  Negative for chest pain.  Gastrointestinal:  Negative for abdominal pain.  Genitourinary:  Negative for difficulty urinating.  Skin:  Negative for rash.  Neurological:  Negative for headaches.    Objective: BP 138/88   Pulse 72   Temp 98.4 F (36.9 C) (Temporal)   Resp 18   Ht 6' 1 (1.854 m)   Wt 284 lb 12.8 oz (129.2 kg)   SpO2 97%   BMI 37.57 kg/m  Body mass index is 37.57 kg/m. Physical Exam Vitals and nursing note reviewed.  Constitutional:      Appearance: Normal appearance. He is well-developed.  HENT:     Head: Normocephalic and atraumatic.     Right Ear: Tympanic membrane and external ear normal.     Left Ear: Tympanic membrane and external ear normal.     Nose: Nose  normal.     Mouth/Throat:     Mouth: Mucous membranes are moist.     Pharynx: Oropharynx is clear.  Eyes:     Conjunctiva/sclera: Conjunctivae normal.  Cardiovascular:     Rate and Rhythm: Normal rate and regular rhythm.     Heart sounds: Normal heart sounds. No murmur heard.    No friction rub. No gallop.  Pulmonary:     Effort: Pulmonary effort is normal.     Breath sounds: Normal breath sounds. No wheezing, rhonchi or rales.  Musculoskeletal:     Cervical back: Neck supple.  Skin:    General: Skin is warm.     Findings: No rash.  Neurological:     Mental Status: He is alert and oriented to person, place, and time.  Psychiatric:        Behavior: Behavior normal.    Previous notes and tests were reviewed. The plan was reviewed with the patient/family, and all questions/concerned were addressed.  It was my pleasure to see Dustin Orr today and participate in his care. Please feel free to contact me with any questions or concerns.  Sincerely,  Orlan Cramp, DO Allergy & Immunology  [facility]  [place] office: [phone] [facility] office: [phone]

## 2024-03-16 ENCOUNTER — Other Ambulatory Visit: Payer: Self-pay | Admitting: Adult Health

## 2024-03-16 DIAGNOSIS — M1A09X0 Idiopathic chronic gout, multiple sites, without tophus (tophi): Secondary | ICD-10-CM

## 2024-03-16 NOTE — Telephone Encounter (Signed)
 Patient need to schedule CPE for more refills.

## 2024-03-19 ENCOUNTER — Encounter: Payer: Self-pay | Admitting: Adult Health

## 2024-03-20 ENCOUNTER — Other Ambulatory Visit: Payer: Self-pay | Admitting: Adult Health

## 2024-03-20 DIAGNOSIS — M1A09X0 Idiopathic chronic gout, multiple sites, without tophus (tophi): Secondary | ICD-10-CM

## 2024-03-20 MED ORDER — ALLOPURINOL 300 MG PO TABS: 300.0000 mg | ORAL_TABLET | Freq: Every day | ORAL | 0 refills | Status: AC

## 2024-04-09 ENCOUNTER — Encounter: Payer: Self-pay | Admitting: Allergy

## 2024-04-09 MED ORDER — AMOXICILLIN 250 MG/5ML PO SUSR: ORAL | 0 refills | Status: AC

## 2024-04-10 ENCOUNTER — Encounter: Payer: Self-pay | Admitting: Allergy

## 2024-04-10 ENCOUNTER — Ambulatory Visit: Admitting: Allergy

## 2024-04-10 ENCOUNTER — Other Ambulatory Visit: Payer: Self-pay

## 2024-04-10 VITALS — BP 122/76 | HR 73 | Temp 98.2°F | Resp 18 | Wt 283.8 lb

## 2024-04-10 DIAGNOSIS — Z880 Allergy status to penicillin: Secondary | ICD-10-CM | POA: Diagnosis not present

## 2024-04-10 DIAGNOSIS — L270 Generalized skin eruption due to drugs and medicaments taken internally: Secondary | ICD-10-CM

## 2024-04-10 NOTE — Patient Instructions (Addendum)
 Penicillin allergy  Skin prick and intradermal testing today to penicillin-G, PrePen were negative. Patient received amoxicillin  50mg  and then 450mg  with no clinical reactions.  Will remove penicillin allergy from drug allergy list. Discussed that his risk of re-developing penicillin allergy is the same as the general populations and should not avoid it if he needs to be treated with penicillin in the future.   Asthma  Daily controller medication(s): continue Wixela 500mcg 1 puff once a day and may take twice a day if needed and rinse mouth after each use. May use albuterol  rescue inhaler 2 puffs every 4 to 6 hours as needed for shortness of breath, chest tightness, coughing, and wheezing. May use albuterol  rescue inhaler 2 puffs 5 to 15 minutes prior to strenuous physical activities. Monitor frequency of use - if you need to use it more than twice per week on a consistent basis let us  know.  Breathing control goals:  Full participation in all desired activities (may need albuterol  before activity) Albuterol  use two times or less a week on average (not counting use with activity) Cough interfering with sleep two times or less a month Oral steroids no more than once a year No hospitalizations   Rhinitis  Use Flonase  (fluticasone ) nasal spray 1-2 sprays per nostril once a day as needed for nasal congestion.  Nasal saline spray (i.e., Simply Saline) or nasal saline lavage (i.e., NeilMed) is recommended as needed and prior to medicated nasal sprays. If symptoms worsening, then recommend allergy testing next.   Follow up in 2 months or sooner if needed.

## 2024-04-10 NOTE — Progress Notes (Signed)
 Follow Up Note  RE: Dustin Orr MRN: [id number] DOB: [birth year] Date of Office Visit: 04/10/2024  Referring provider: Merna Huxley, NP Primary care provider: Merna Huxley, NP  Chief Complaint: Food/Drug Challenge (Pcn  skin test and amox oral challenge)  Assessment and Plan: [name] is a 48 y.o. male with: Penicillin allergy Skin prick and intradermal testing today to penicillin-G, PrePen were negative. Patient received amoxicillin  50mg  and then 450mg  with no clinical reactions.  Will remove penicillin allergy from drug allergy list. Discussed that his risk of re-developing penicillin allergy is the same as the general populations and should not avoid it if he needs to be treated with penicillin in the future.   Return in about 2 months (around 06/11/2024).  Plan: Challenge drug: amoxicillin  Challenge as per protocol: Passed Total time: 91 min  History of Present Illness: I had the pleasure of seeing [name] for a follow up visit at the [facility] on 04/10/2024. He is a 48 y.o. male, who is being followed for penicillin allergy, asthma, allergic rhinitis. His previous allergy office visit was on 01/10/2024 with Dr. Luke. Today he is here for penicillin drug testing and challenge.   Discussed the use of AI scribe software for clinical note transcription with the patient, who gave verbal consent to proceed.    He has a history of a rash and hives on his legs after taking amoxicillin  as a teenager. The rash appeared after two doses and resolved quickly. Since then, he has avoided penicillin and amoxicillin . He does not recall any other symptoms associated with the rash and has not taken any penicillin-based antibiotics since that incident.  He has asthma and currently uses Wixela, which he finds effective but notes it causes discomfort if not taken every days. He previously used Flovent , which he took once daily at a higher dose and found it effective  without adverse effects. However, Flovent  is no longer affordable due to insurance changes. He also uses albuterol  and has recently refilled his prescription.  The patient reports no current rash, itching, swelling, coughing, shortness of breath, or nasal congestion. He has not taken any antihistamines in the past month and does not use beta blockers. He mentions taking lisinopril  for blood pressure management.     Interval History: Patient has not been ill, he has not had any accidental exposures to the culprit medication.   Recent/Current History: Pulmonary disease: yes Asthma stable on Wixela.   Cardiac disease: no Respiratory infection: no Rash: no Itch: no Swelling: no Cough: no Shortness of breath: no Runny/stuffy nose: no Itchy eyes: no Beta-blocker use: no  Patient/guardian was informed of the test procedure with verbalized understanding of the risk of anaphylaxis. Consent was signed.   Last antihistamine use: none in the past 3 days Last beta-blocker use: n/a  Medication List:  Current Outpatient Medications  Medication Sig Dispense Refill   albuterol  (VENTOLIN  HFA) 108 (90 Base) MCG/ACT inhaler Inhale 2 puffs into the lungs every 4 (four) hours as needed for wheezing or shortness of breath (coughing fits). 18 g 3   allopurinol  (ZYLOPRIM ) 300 MG tablet Take 1 tablet (300 mg total) by mouth daily. 90 tablet 0   amoxicillin  (AMOXIL ) 250 MG/5ML suspension Do NOT take at home. Bring to Dr. Mariella office for drug challenge. Keep in fridge. 80 mL 0   Azelastine -Fluticasone  137-50 MCG/ACT SUSP Place 1 spray into the nose in the morning and at bedtime. 23 g 5   colchicine   0.6 MG tablet TAKE 1 TABLET BY MOUTH DAILY. TAKE TWO TABS ON 1ST DAY OF ACUTE FLARE AND THEN 1 TAB DAILY UNTIL FLARE HAS RESOLVED 30 tablet 1   indomethacin (INDOCIN) 50 MG capsule Take by mouth.     lisinopril  (ZESTRIL ) 10 MG tablet Take 1 tablet (10 mg total) by mouth daily. 90 tablet 0   loratadine (CLARITIN  REDITABS) 10 MG dissolvable tablet Take by mouth.     Multiple Vitamin (MULTIVITAMIN WITH MINERALS) TABS tablet Take 1 tablet by mouth daily.     Probiotic Product (PROBIOTIC ADVANCED PO) Take 1 tablet by mouth daily.     tadalafil  (CIALIS ) 20 MG tablet Take 0.5-1 tablets (10-20 mg total) by mouth every other day as needed for erectile dysfunction. 10 tablet 6   WIXELA INHUB 500-50 MCG/ACT AEPB INHALE 1 PUFF INTO THE LUNGS IN THE MORNING AND AT BEDTIME. RINSE MOUTH AFTER EACH USE. 180 each 2   No current facility-administered medications for this visit.   Allergies: No Known Allergies  I reviewed his past medical history, social history, family history, and environmental history and no significant changes have been reported from his previous visit.  Review of Systems  Constitutional:  Negative for appetite change, chills, fever and unexpected weight change.  HENT:  Negative for congestion and rhinorrhea.   Eyes:  Negative for itching.  Respiratory:  Negative for cough, chest tightness, shortness of breath and wheezing.   Cardiovascular:  Negative for chest pain.  Gastrointestinal:  Negative for abdominal pain.  Genitourinary:  Negative for difficulty urinating.  Skin:  Negative for rash.  Neurological:  Negative for headaches.   Objective: BP 122/76   Pulse 73   Temp 98.2 F (36.8 C) (Temporal)   Resp 18   Wt 283 lb 12.8 oz (128.7 kg)   SpO2 97%   BMI 37.44 kg/m  Body mass index is 37.44 kg/m. Physical Exam Vitals and nursing note reviewed.  Constitutional:      Appearance: Normal appearance. He is well-developed.  HENT:     Head: Normocephalic and atraumatic.     Right Ear: Tympanic membrane and external ear normal.     Left Ear: Tympanic membrane and external ear normal.     Nose: Nose normal.     Mouth/Throat:     Mouth: Mucous membranes are moist.     Pharynx: Oropharynx is clear.  Eyes:     Conjunctiva/sclera: Conjunctivae normal.  Cardiovascular:     Rate and  Rhythm: Normal rate and regular rhythm.     Heart sounds: Normal heart sounds. No murmur heard.    No friction rub. No gallop.  Pulmonary:     Effort: Pulmonary effort is normal.     Breath sounds: Normal breath sounds. No wheezing, rhonchi or rales.  Musculoskeletal:     Cervical back: Neck supple.  Skin:    General: Skin is warm.     Findings: No rash.  Neurological:     Mental Status: He is alert and oriented to person, place, and time.  Psychiatric:        Behavior: Behavior normal.     Diagnostics: Skin prick and intradermal testing today to penicillin-G, PrePen were negative. Results discussed with patient/family.  Oral Challenge - 04/10/24 1200     Challenge Food/Drug amoxcicillin    Lot #  if Applicable [id number]    Food/Drug provided by patient    BP 128/78    Pulse 91    Respirations 20  Lungs clear    Skin clear    Mouth clear    Time 1205    Dose 1cc    Lungs clear    Skin clear    Time 1224    Dose 9cc    Lungs clear    Skin clear    Additional Dose No    Time 1327    Dose vitals    BP 122/76    Pulse 73    Respirations 18    Lungs clear    Skin clear    Mouth clear    Comments passed          Penicillin - 04/10/24 1144     Manufacturer aller quest    Lot # [id number] ex 08/2024    Location Arm    Number of allergen test 8    Time Testing Placed 1110    Control SPT Negative    Histamine SPT 3+    Pre-Pen Puncture Negative    Penicillin-G 5000 u/ml SPT Negative    Time Testing Placed 1131    Control Intradermal Negative    Pre-Pen Intradermal Negative    Penicillin-G 50 u/ml Intradermal Negative    Time Testing Placed 1147    Penicillin-G 500u/ml Intradermal Negative    Comments passed/ proceed to oral          Previous notes and tests were reviewed. The plan was reviewed with the patient/family, and all questions/concerned were addressed.  It was my pleasure to see Zaiden today and participate in his care. Please feel free to  contact me with any questions or concerns.  Sincerely,  Orlan Cramp, DO Allergy & Immunology  [facility]  [facility] office: [phone] [facility] office: [phone]

## 2024-04-20 ENCOUNTER — Ambulatory Visit: Payer: Self-pay | Admitting: Adult Health

## 2024-04-20 ENCOUNTER — Ambulatory Visit (INDEPENDENT_AMBULATORY_CARE_PROVIDER_SITE_OTHER): Admitting: Adult Health

## 2024-04-20 ENCOUNTER — Encounter: Payer: Self-pay | Admitting: Adult Health

## 2024-04-20 VITALS — BP 120/80 | HR 89 | Temp 98.2°F | Ht 74.0 in | Wt 282.0 lb

## 2024-04-20 DIAGNOSIS — I10 Essential (primary) hypertension: Secondary | ICD-10-CM

## 2024-04-20 DIAGNOSIS — R7303 Prediabetes: Secondary | ICD-10-CM

## 2024-04-20 DIAGNOSIS — E66812 Obesity, class 2: Secondary | ICD-10-CM | POA: Diagnosis not present

## 2024-04-20 DIAGNOSIS — J4530 Mild persistent asthma, uncomplicated: Secondary | ICD-10-CM | POA: Diagnosis not present

## 2024-04-20 DIAGNOSIS — Z0000 Encounter for general adult medical examination without abnormal findings: Secondary | ICD-10-CM

## 2024-04-20 DIAGNOSIS — Z125 Encounter for screening for malignant neoplasm of prostate: Secondary | ICD-10-CM

## 2024-04-20 DIAGNOSIS — M1A09X0 Idiopathic chronic gout, multiple sites, without tophus (tophi): Secondary | ICD-10-CM

## 2024-04-20 DIAGNOSIS — N529 Male erectile dysfunction, unspecified: Secondary | ICD-10-CM

## 2024-04-20 DIAGNOSIS — Z23 Encounter for immunization: Secondary | ICD-10-CM

## 2024-04-20 LAB — LIPID PANEL
Cholesterol: 122 mg/dL (ref 28–200)
HDL: 39.9 mg/dL
LDL Cholesterol: 67 mg/dL (ref 10–99)
NonHDL: 82.1
Total CHOL/HDL Ratio: 3
Triglycerides: 76 mg/dL (ref 10.0–149.0)
VLDL: 15.2 mg/dL (ref 0.0–40.0)

## 2024-04-20 LAB — COMPREHENSIVE METABOLIC PANEL WITH GFR
ALT: 91 U/L — ABNORMAL HIGH (ref 3–53)
AST: 67 U/L — ABNORMAL HIGH (ref 5–37)
Albumin: 4.7 g/dL (ref 3.5–5.2)
Alkaline Phosphatase: 102 U/L (ref 39–117)
BUN: 13 mg/dL (ref 6–23)
CO2: 30 meq/L (ref 19–32)
Calcium: 9.2 mg/dL (ref 8.4–10.5)
Chloride: 102 meq/L (ref 96–112)
Creatinine, Ser: 1.23 mg/dL (ref 0.40–1.50)
GFR: 69.25 mL/min
Glucose, Bld: 84 mg/dL (ref 70–99)
Potassium: 4.4 meq/L (ref 3.5–5.1)
Sodium: 140 meq/L (ref 135–145)
Total Bilirubin: 0.9 mg/dL (ref 0.2–1.2)
Total Protein: 7.1 g/dL (ref 6.0–8.3)

## 2024-04-20 LAB — CBC
HCT: 44.3 % (ref 39.0–52.0)
Hemoglobin: 15.3 g/dL (ref 13.0–17.0)
MCHC: 34.5 g/dL (ref 30.0–36.0)
MCV: 90.7 fl (ref 78.0–100.0)
Platelets: 236 K/uL (ref 150.0–400.0)
RBC: 4.88 Mil/uL (ref 4.22–5.81)
RDW: 13.1 % (ref 11.5–15.5)
WBC: 7.6 K/uL (ref 4.0–10.5)

## 2024-04-20 LAB — URIC ACID: Uric Acid, Serum: 6.8 mg/dL (ref 4.0–7.8)

## 2024-04-20 LAB — HEMOGLOBIN A1C: Hgb A1c MFr Bld: 6.1 % (ref 4.6–6.5)

## 2024-04-20 LAB — TSH: TSH: 2.42 u[IU]/mL (ref 0.35–5.50)

## 2024-04-20 LAB — PSA: PSA: 0.1 ng/mL (ref 0.10–4.00)

## 2024-04-20 MED ORDER — LISINOPRIL 5 MG PO TABS: 5.0000 mg | ORAL_TABLET | Freq: Every day | ORAL | 3 refills | Status: AC

## 2024-04-20 MED ORDER — TADALAFIL 20 MG PO TABS: 10.0000 mg | ORAL_TABLET | ORAL | 6 refills | Status: AC | PRN

## 2024-04-20 NOTE — Patient Instructions (Signed)
 It was great seeing you today   We will follow up with you regarding your lab work   Please let me know if you need anything

## 2024-04-20 NOTE — Progress Notes (Signed)
 "  Subjective:    Patient ID: Dustin Orr, male    DOB: 1975-12-06, 48 y.o.   MRN: [id number]  HPI Patient presents for yearly preventative medicine examination. He is a pleasant 48 year old male who  has a past medical history of Asthma and Gout.  Asthma-is seen by allergy and asthma at [facility].  Currently controlled on Flovent  2 puffs twice daily as needed and albuterol  2 puffs every 6 hours as needed.  He reports that his symptoms are generally well controlled and he typically needs Flovent  in the morning and maybe uses his rescue inhaler once a week. He would like to be referred to [facility]. He has not been able to get an appointment with [facility] and is in need of his inhalers.   Gout- Has been taking 150 mg daily.  He denies any recent gout flares.   ED - currently prescribed Cialis  20 mg daily PRN  Prediabetes - not currently on medication  Lab Results  Component Value Date   HGBA1C 5.8 02/11/2023   HGBA1C 5.8 01/01/2022   Hypertension - prescribed lisinopril  10 mg daily but has been taking 5 mg due to hypotension with 10 mg dose.SABRA He denies dizziness, lightheaded, blurred vision or chest pain with 5 mg dose.  BP Readings from Last 3 Encounters:  04/20/24 120/80  04/10/24 122/76  01/10/24 138/88   ADHD - has been seen by [facility] and was recently started on Focalin XR  and is titration up to 20 mg daily.  SABRA He has noticed a difference in his concentration since starting.  He denies any side effects.   All immunizations and health maintenance protocols were reviewed with the patient and needed orders were placed.  Appropriate screening laboratory values were ordered for the patient including screening of hyperlipidemia, renal function and hepatic function. If indicated by BPH, a PSA was ordered.  Medication reconciliation,  past medical history, social history, problem list and allergies were reviewed in detail with the  patient  Goals were established with regard to weight loss, exercise, and  diet in compliance with medications. He is been doing more exercise recently and his portion sizes have been cut down since starting ADHD medication.  Wt Readings from Last 3 Encounters:  04/20/24 282 lb (127.9 kg)  04/10/24 283 lb 12.8 oz (128.7 kg)  01/10/24 284 lb 12.8 oz (129.2 kg)   He is up to date on routine colon cancer screening   Overall he feels well and has no acute complaints.    Review of Systems  Constitutional: Negative.   HENT: Negative.    Eyes: Negative.   Respiratory: Negative.    Cardiovascular: Negative.   Gastrointestinal: Negative.   Endocrine: Negative.   Genitourinary: Negative.   Musculoskeletal: Negative.   Skin: Negative.   Allergic/Immunologic: Negative.   Neurological: Negative.   Hematological: Negative.   Psychiatric/Behavioral: Negative.    All other systems reviewed and are negative.  Past Medical History:  Diagnosis Date   Asthma    Gout     Social History   Socioeconomic History   Marital status: Married    Spouse name: Not on file   Number of children: Not on file   Years of education: Not on file   Highest education level: Bachelor's degree (e.g., BA, AB, BS)  Occupational History   Not on file  Tobacco Use   Smoking status: Never   Smokeless tobacco: Never  Vaping Use  Vaping status: Never Used  Substance and Sexual Activity   Alcohol use: Yes    Alcohol/week: 12.0 standard drinks of alcohol    Types: 6 Glasses of wine, 6 Cans of beer per week    Comment: Weekends   Drug use: No   Sexual activity: Yes  Other Topics Concern   Not on file  Social History Narrative   Engineer    Social Drivers of Health   Tobacco Use: Low Risk (04/20/2024)   Patient History    Smoking Tobacco Use: Never    Smokeless Tobacco Use: Never    Passive Exposure: Not on file  Financial Resource Strain: Low Risk (04/19/2024)   Overall Financial Resource Strain  (CARDIA)    Difficulty of Paying Living Expenses: Not very hard  Food Insecurity: No Food Insecurity (04/19/2024)   Epic    Worried About Radiation Protection Practitioner of Food in the Last Year: Never true    Ran Out of Food in the Last Year: Never true  Transportation Needs: No Transportation Needs (04/19/2024)   Epic    Lack of Transportation (Medical): No    Lack of Transportation (Non-Medical): No  Physical Activity: Sufficiently Active (04/19/2024)   Exercise Vital Sign    Days of Exercise per Week: 5 days    Minutes of Exercise per Session: 40 min  Stress: No Stress Concern Present (04/19/2024)   [employer] of Occupational Health - Occupational Stress Questionnaire    Feeling of Stress: Only a little  Social Connections: Socially Integrated (04/19/2024)   Social Connection and Isolation Panel    Frequency of Communication with Friends and Family: More than three times a week    Frequency of Social Gatherings with Friends and Family: Twice a week    Attends Religious Services: More than 4 times per year    Active Member of Clubs or Organizations: Yes    Attends Banker Meetings: More than 4 times per year    Marital Status: Married  Catering Manager Violence: Not on file  Depression (PHQ2-9): Low Risk (04/20/2024)   Depression (PHQ2-9)    PHQ-2 Score: 0  Alcohol Screen: Low Risk (04/19/2024)   Alcohol Screen    Last Alcohol Screening Score (AUDIT): 5  Housing: Low Risk (04/19/2024)   Epic    Unable to Pay for Housing in the Last Year: No    Number of Times Moved in the Last Year: 0    Homeless in the Last Year: No  Utilities: Not on file  Health Literacy: Not on file    History reviewed. No pertinent surgical history.  Family History  Problem Relation Age of Onset   Hearing loss Mother    Hearing loss Father    Diabetes Maternal Grandfather    Scoliosis Paternal Grandmother     Allergies[1]  Medications Ordered Prior to Encounter[2]  BP 120/80   Pulse 89    Temp 98.2 F (36.8 C) (Oral)   Ht 6' 2 (1.88 m)   Wt 282 lb (127.9 kg)   SpO2 95%   BMI 36.21 kg/m       Objective:   Physical Exam Vitals and nursing note reviewed.  Constitutional:      General: He is not in acute distress.    Appearance: Normal appearance. He is obese. He is not ill-appearing.  HENT:     Head: Normocephalic and atraumatic.     Right Ear: Tympanic membrane, ear canal and external ear normal. There is no impacted cerumen.  Left Ear: Tympanic membrane, ear canal and external ear normal. There is no impacted cerumen.     Nose: Nose normal. No congestion or rhinorrhea.     Mouth/Throat:     Mouth: Mucous membranes are moist.     Pharynx: Oropharynx is clear.  Eyes:     Extraocular Movements: Extraocular movements intact.     Conjunctiva/sclera: Conjunctivae normal.     Pupils: Pupils are equal, round, and reactive to light.  Neck:     Vascular: No carotid bruit.  Cardiovascular:     Rate and Rhythm: Normal rate and regular rhythm.     Pulses: Normal pulses.     Heart sounds: No murmur heard.    No friction rub. No gallop.  Pulmonary:     Effort: Pulmonary effort is normal.     Breath sounds: Normal breath sounds.  Abdominal:     General: Abdomen is flat. Bowel sounds are normal. There is no distension.     Palpations: Abdomen is soft. There is no mass.     Tenderness: There is no abdominal tenderness. There is no guarding or rebound.     Hernia: No hernia is present.  Musculoskeletal:        General: Normal range of motion.     Cervical back: Normal range of motion and neck supple.  Lymphadenopathy:     Cervical: No cervical adenopathy.  Skin:    General: Skin is warm and dry.     Capillary Refill: Capillary refill takes less than 2 seconds.  Neurological:     General: No focal deficit present.     Mental Status: He is alert and oriented to person, place, and time.  Psychiatric:        Mood and Affect: Mood normal.        Behavior: Behavior  normal.        Thought Content: Thought content normal.        Judgment: Judgment normal.        Assessment & Plan:  1. Routine general medical examination at a health care facility (Primary) Today patient counseled on age appropriate routine health concerns for screening and prevention, each reviewed and up to date or declined. Immunizations reviewed and up to date or declined. Labs ordered and reviewed. Risk factors for depression reviewed and negative. Hearing function and visual acuity are intact. ADLs screened and addressed as needed. Functional ability and level of safety reviewed and appropriate. Education, counseling and referrals performed based on assessed risks today. Patient provided with a copy of personalized plan for preventive services. - Follow up in one year or sooner if needed  2. Mild persistent asthma without complication - Controlled. Follow up with Allergy and Asthma as directed  3. Chronic gout of multiple sites, unspecified cause - Controlled.  - Lipid panel; Future - TSH; Future - CBC; Future - Comprehensive metabolic panel with GFR; Future - Uric Acid; Future - Uric Acid - Comprehensive metabolic panel with GFR - CBC - Lipid panel - TSH  4. Erectile dysfunction, unspecified erectile dysfunction type - Continue with Cialis  as needed - Lipid panel; Future - TSH; Future - CBC; Future - Comprehensive metabolic panel with GFR; Future - Comprehensive metabolic panel with GFR - CBC - Lipid panel - TSH - tadalafil  (CIALIS ) 20 MG tablet; Take 0.5-1 tablets (10-20 mg total) by mouth every other day as needed for erectile dysfunction.  Dispense: 10 tablet; Refill: 6  5. Prediabetes - Consider metformin  - Lipid panel;  Future - TSH; Future - CBC; Future - Comprehensive metabolic panel with GFR; Future - Hemoglobin A1c; Future - Hemoglobin A1c - Comprehensive metabolic panel with GFR - CBC - Lipid panel - TSH  6. Primary hypertension - Controlled on  5 mg dose. I am hopeful that with some weight loss he can come off this medication  - Lipid panel; Future - TSH; Future - CBC; Future - Comprehensive metabolic panel with GFR; Future - Comprehensive metabolic panel with GFR - CBC - Lipid panel - TSH - lisinopril  (ZESTRIL ) 5 MG tablet; Take 1 tablet (5 mg total) by mouth daily.  Dispense: 90 tablet; Refill: 3  7. Prostate cancer screening  - PSA; Future - PSA  8. Immunization due  - Pneumococcal conjugate vaccine 20-valent (Prevnar 20)  9. Class 2 obesity - Continue to work on lifestyle modifications  - Lipid panel; Future - TSH; Future - CBC; Future - Comprehensive metabolic panel with GFR; Future - Hemoglobin A1c; Future  Darleene Shape, NP     [1] No Known Allergies [2]  Current Outpatient Medications on File Prior to Visit  Medication Sig Dispense Refill   albuterol  (VENTOLIN  HFA) 108 (90 Base) MCG/ACT inhaler Inhale 2 puffs into the lungs every 4 (four) hours as needed for wheezing or shortness of breath (coughing fits). 18 g 3   allopurinol  (ZYLOPRIM ) 300 MG tablet Take 1 tablet (300 mg total) by mouth daily. 90 tablet 0   amoxicillin  (AMOXIL ) 250 MG/5ML suspension Do NOT take at home. Bring to Dr. Mariella office for drug challenge. Keep in fridge. 80 mL 0   Azelastine -Fluticasone  137-50 MCG/ACT SUSP Place 1 spray into the nose in the morning and at bedtime. 23 g 5   dexmethylphenidate (FOCALIN XR) 10 MG 24 hr capsule Take 10 mg by mouth every morning.     indomethacin (INDOCIN) 50 MG capsule Take by mouth.     lisinopril  (ZESTRIL ) 10 MG tablet Take 1 tablet (10 mg total) by mouth daily. 90 tablet 0   loratadine (CLARITIN REDITABS) 10 MG dissolvable tablet Take by mouth.     Multiple Vitamin (MULTIVITAMIN WITH MINERALS) TABS tablet Take 1 tablet by mouth daily.     Probiotic Product (PROBIOTIC ADVANCED PO) Take 1 tablet by mouth daily.     tadalafil  (CIALIS ) 20 MG tablet Take 0.5-1 tablets (10-20 mg total) by mouth  every other day as needed for erectile dysfunction. 10 tablet 6   WIXELA INHUB 500-50 MCG/ACT AEPB INHALE 1 PUFF INTO THE LUNGS IN THE MORNING AND AT BEDTIME. RINSE MOUTH AFTER EACH USE. 180 each 2   colchicine  0.6 MG tablet TAKE 1 TABLET BY MOUTH DAILY. TAKE TWO TABS ON 1ST DAY OF ACUTE FLARE AND THEN 1 TAB DAILY UNTIL FLARE HAS RESOLVED (Patient not taking: Reported on 04/20/2024) 30 tablet 1   doxycycline  (MONODOX ) 100 MG capsule Take by mouth. (Patient not taking: Reported on 04/20/2024)     No current facility-administered medications on file prior to visit.   "
